# Patient Record
Sex: Female | Born: 1980 | Race: White | Hispanic: No | Marital: Single | Smoking: Current every day smoker
Health system: Southern US, Community
[De-identification: ages and names within clinical notes are randomized; demographics above are authoritative.]

## PROBLEM LIST (undated history)

## (undated) DIAGNOSIS — F419 Anxiety disorder, unspecified: Secondary | ICD-10-CM

## (undated) DIAGNOSIS — F410 Panic disorder [episodic paroxysmal anxiety] without agoraphobia: Secondary | ICD-10-CM

## (undated) DIAGNOSIS — J45909 Unspecified asthma, uncomplicated: Secondary | ICD-10-CM

## (undated) DIAGNOSIS — A048 Other specified bacterial intestinal infections: Secondary | ICD-10-CM

## (undated) DIAGNOSIS — J302 Other seasonal allergic rhinitis: Secondary | ICD-10-CM

## (undated) HISTORY — PX: TONSILLECTOMY: SUR1361

## (undated) HISTORY — PX: TUBAL LIGATION: SHX77

---

## 2005-01-10 ENCOUNTER — Emergency Department: Payer: Self-pay | Admitting: Emergency Medicine

## 2005-01-15 ENCOUNTER — Emergency Department: Payer: Self-pay | Admitting: Emergency Medicine

## 2005-01-19 ENCOUNTER — Emergency Department: Payer: Self-pay | Admitting: Emergency Medicine

## 2005-01-20 ENCOUNTER — Ambulatory Visit: Payer: Self-pay | Admitting: Unknown Physician Specialty

## 2005-01-20 ENCOUNTER — Ambulatory Visit: Payer: Self-pay | Admitting: Emergency Medicine

## 2005-04-28 ENCOUNTER — Emergency Department: Payer: Self-pay | Admitting: Emergency Medicine

## 2005-04-29 ENCOUNTER — Ambulatory Visit: Payer: Self-pay | Admitting: Emergency Medicine

## 2005-05-15 ENCOUNTER — Emergency Department: Payer: Self-pay | Admitting: General Practice

## 2005-06-28 ENCOUNTER — Emergency Department: Payer: Self-pay | Admitting: Emergency Medicine

## 2005-08-29 ENCOUNTER — Observation Stay: Payer: Self-pay

## 2005-11-11 ENCOUNTER — Observation Stay: Payer: Self-pay

## 2005-12-14 ENCOUNTER — Inpatient Hospital Stay: Payer: Self-pay | Admitting: Obstetrics & Gynecology

## 2006-03-02 ENCOUNTER — Emergency Department: Payer: Self-pay | Admitting: Emergency Medicine

## 2006-04-20 ENCOUNTER — Emergency Department: Payer: Self-pay | Admitting: Emergency Medicine

## 2006-10-05 ENCOUNTER — Ambulatory Visit: Payer: Self-pay | Admitting: Internal Medicine

## 2006-10-06 ENCOUNTER — Emergency Department: Payer: Self-pay | Admitting: Emergency Medicine

## 2006-11-26 ENCOUNTER — Ambulatory Visit: Payer: Self-pay | Admitting: Emergency Medicine

## 2007-05-22 ENCOUNTER — Emergency Department: Payer: Self-pay | Admitting: Emergency Medicine

## 2007-07-19 ENCOUNTER — Emergency Department: Payer: Self-pay | Admitting: Emergency Medicine

## 2008-01-07 ENCOUNTER — Emergency Department: Payer: Self-pay | Admitting: Emergency Medicine

## 2008-08-09 ENCOUNTER — Emergency Department: Payer: Self-pay | Admitting: Emergency Medicine

## 2008-08-18 ENCOUNTER — Emergency Department: Payer: Self-pay | Admitting: Emergency Medicine

## 2009-02-22 ENCOUNTER — Emergency Department: Payer: Self-pay | Admitting: Emergency Medicine

## 2009-08-25 ENCOUNTER — Emergency Department: Payer: Self-pay | Admitting: Emergency Medicine

## 2010-04-11 ENCOUNTER — Emergency Department: Payer: Self-pay | Admitting: Internal Medicine

## 2010-11-27 ENCOUNTER — Ambulatory Visit: Payer: Self-pay

## 2011-05-22 ENCOUNTER — Observation Stay: Payer: Self-pay | Admitting: Obstetrics and Gynecology

## 2011-05-22 LAB — URINALYSIS, COMPLETE
Bilirubin,UR: NEGATIVE
Ph: 6 (ref 4.5–8.0)
Protein: 30
WBC UR: 120 /HPF (ref 0–5)

## 2011-05-22 LAB — DRUG SCREEN, URINE
Amphetamines, Ur Screen: NEGATIVE (ref ?–1000)
Barbiturates, Ur Screen: NEGATIVE (ref ?–200)
Benzodiazepine, Ur Scrn: POSITIVE (ref ?–200)
Methadone, Ur Screen: NEGATIVE (ref ?–300)
Opiate, Ur Screen: NEGATIVE (ref ?–300)
Phencyclidine (PCP) Ur S: NEGATIVE (ref ?–25)
Tricyclic, Ur Screen: NEGATIVE (ref ?–1000)

## 2011-05-24 ENCOUNTER — Inpatient Hospital Stay: Payer: Self-pay

## 2011-05-24 LAB — CBC WITH DIFFERENTIAL/PLATELET
Basophil #: 0 10*3/uL (ref 0.0–0.1)
Eosinophil #: 0.1 10*3/uL (ref 0.0–0.7)
Eosinophil %: 1 %
HCT: 27.4 % — ABNORMAL LOW (ref 35.0–47.0)
Lymphocyte #: 2.2 10*3/uL (ref 1.0–3.6)
Lymphocyte %: 26.1 %
MCV: 80 fL (ref 80–100)
Monocyte %: 7.9 %
Neutrophil #: 5.5 10*3/uL (ref 1.4–6.5)
RBC: 3.43 10*6/uL — ABNORMAL LOW (ref 3.80–5.20)
WBC: 8.6 10*3/uL (ref 3.6–11.0)

## 2011-05-28 LAB — AEROBIC CULTURE

## 2011-07-19 ENCOUNTER — Emergency Department: Payer: Self-pay | Admitting: Emergency Medicine

## 2012-02-06 ENCOUNTER — Emergency Department: Payer: Self-pay | Admitting: Emergency Medicine

## 2012-03-22 ENCOUNTER — Emergency Department: Payer: Self-pay | Admitting: Emergency Medicine

## 2012-03-22 LAB — RAPID INFLUENZA A&B ANTIGENS

## 2012-05-26 ENCOUNTER — Encounter (HOSPITAL_COMMUNITY): Payer: Self-pay | Admitting: Emergency Medicine

## 2012-05-26 ENCOUNTER — Emergency Department (HOSPITAL_COMMUNITY)
Admission: EM | Admit: 2012-05-26 | Discharge: 2012-05-26 | Disposition: A | Discharge status: Home / Self Care | Payer: Medicaid Other | Attending: Emergency Medicine | Admitting: Emergency Medicine

## 2012-05-26 DIAGNOSIS — F411 Generalized anxiety disorder: Secondary | ICD-10-CM | POA: Insufficient documentation

## 2012-05-26 DIAGNOSIS — F17200 Nicotine dependence, unspecified, uncomplicated: Secondary | ICD-10-CM | POA: Insufficient documentation

## 2012-05-26 DIAGNOSIS — N72 Inflammatory disease of cervix uteri: Secondary | ICD-10-CM | POA: Insufficient documentation

## 2012-05-26 DIAGNOSIS — J45909 Unspecified asthma, uncomplicated: Secondary | ICD-10-CM | POA: Insufficient documentation

## 2012-05-26 DIAGNOSIS — N949 Unspecified condition associated with female genital organs and menstrual cycle: Secondary | ICD-10-CM | POA: Insufficient documentation

## 2012-05-26 DIAGNOSIS — Z3202 Encounter for pregnancy test, result negative: Secondary | ICD-10-CM | POA: Insufficient documentation

## 2012-05-26 DIAGNOSIS — Z8659 Personal history of other mental and behavioral disorders: Secondary | ICD-10-CM | POA: Insufficient documentation

## 2012-05-26 DIAGNOSIS — Z8619 Personal history of other infectious and parasitic diseases: Secondary | ICD-10-CM | POA: Insufficient documentation

## 2012-05-26 HISTORY — DX: Anxiety disorder, unspecified: F41.9

## 2012-05-26 HISTORY — DX: Unspecified asthma, uncomplicated: J45.909

## 2012-05-26 HISTORY — DX: Panic disorder (episodic paroxysmal anxiety): F41.0

## 2012-05-26 LAB — URINALYSIS, ROUTINE W REFLEX MICROSCOPIC
Hgb urine dipstick: NEGATIVE
Nitrite: NEGATIVE
Protein, ur: NEGATIVE mg/dL
Specific Gravity, Urine: 1.025 (ref 1.005–1.030)
Urobilinogen, UA: 0.2 mg/dL (ref 0.0–1.0)

## 2012-05-26 LAB — WET PREP, GENITAL
Clue Cells Wet Prep HPF POC: NONE SEEN
WBC, Wet Prep HPF POC: NONE SEEN

## 2012-05-26 MED ORDER — LIDOCAINE HCL (PF) 1 % IJ SOLN
INTRAMUSCULAR | Status: AC
  Administered 2012-05-26: 0.9 mL via INTRAMUSCULAR
  Filled 2012-05-26: qty 5

## 2012-05-26 MED ORDER — CEFTRIAXONE SODIUM 250 MG IJ SOLR
250.0000 mg | Freq: Once | INTRAMUSCULAR | Status: AC
  Administered 2012-05-26: 250 mg via INTRAMUSCULAR
  Filled 2012-05-26: qty 250

## 2012-05-26 MED ORDER — AZITHROMYCIN 250 MG PO TABS
ORAL_TABLET | ORAL | Status: AC
  Filled 2012-05-26: qty 1

## 2012-05-26 MED ORDER — AZITHROMYCIN 250 MG PO TABS
1000.0000 mg | ORAL_TABLET | Freq: Once | ORAL | Status: AC
  Administered 2012-05-26: 1000 mg via ORAL
  Filled 2012-05-26: qty 4

## 2012-05-26 NOTE — ED Notes (Signed)
Patient reports has had intercourse with person recently diagnosed with gonorrhea. Also complaining of pelvic pain, denies discharge or other symptoms.

## 2012-05-26 NOTE — ED Provider Notes (Signed)
History     CSN: [id number]  Arrival date & time 05/26/12  4098   First MD Initiated Contact with Patient 05/26/12 1935      Chief Complaint  Patient presents with  . SEXUALLY TRANSMITTED DISEASE    Patient is a 32 y.o. female presenting with abdominal pain. The history is provided by the patient.  Abdominal Pain Pain location:  RLQ and LLQ Pain quality: aching   Pain radiates to:  Does not radiate Pain severity:  Moderate Onset quality:  Gradual Duration: "several days ago" Timing:  Constant Progression:  Worsening Chronicity:  New Context: recent sexual activity   Relieved by:  Nothing Associated symptoms: no dysuria, no fever, no vaginal bleeding, no vaginal discharge and no vomiting    Pt reports she knows she has gonorrhea.  She reports she had negative gonorrhea test last month, but was told she has been exposed to gonorrhea since then . She now reports pelvic pain.  No vaginal discharge.  No fever or chills is reported.  She requests treatment and discharge home.   Past Medical History  Diagnosis Date  . Anxiety   . Asthma   . Panic attacks     Past Surgical History  Procedure Laterality Date  . Cesarean section    . Tonsillectomy      History reviewed. No pertinent family history.  History  Substance Use Topics  . Smoking status: Current Every Day Smoker  . Smokeless tobacco: Not on file  . Alcohol Use: No    OB History   Grav Para Term Preterm Abortions TAB SAB Ect Mult Living                  Review of Systems  Constitutional: Negative for fever.  Gastrointestinal: Positive for abdominal pain. Negative for vomiting.  Genitourinary: Negative for dysuria, vaginal bleeding and vaginal discharge.    Allergies  Review of patient's allergies indicates no known allergies.  Home Medications  No current outpatient prescriptions on file.  BP 123/87  Pulse 84  Temp(Src) 97.9 F (36.6 C) (Oral)  Resp 20  Ht 5\' 5"  (1.651 m)  Wt 180 lb (81.647 kg)   BMI 29.95 kg/m2  SpO2 100%  LMP 05/14/2012  Physical Exam CONSTITUTIONAL: Well developed/well nourished HEAD: Normocephalic/atraumatic EYES: EOMI/PERRL ENMT: Mucous membranes moist NECK: supple no meningeal signs SPINE:entire spine nontender CV: S1/S2 noted, no murmurs/rubs/gallops noted LUNGS: Lungs are clear to auscultation bilaterally, no apparent distress ABDOMEN: soft, nontender, no rebound or guarding GU:no cva tenderness NEURO: Pt is awake/alert, moves all extremitiesx4 EXTREMITIES: pulses normal, full ROM SKIN: warm, color normal PSYCH: anxious  ED Course  Procedures (including critical care time)  Labs Reviewed  GC/CHLAMYDIA PROBE AMP  WET PREP, GENITAL  URINALYSIS, ROUTINE W REFLEX MICROSCOPIC   Pt initially deferring pelvic exam as she wants to leave.  I told her that I can not fully r/o an acute process without pelvic exam.     Pt agreed to pelvic exam Pelvic performed with nurse present +Cmt, but no adnexal tenderness or mass.  No vag bleeding/discharge Cultures sent Will treat empirically for cervicitis   MDM  Nursing notes including past medical history and social history reviewed and considered in documentation Labs/vital reviewed and considered         Joya Gaskins, MD 05/26/12 2134

## 2012-05-30 LAB — GC/CHLAMYDIA PROBE AMP
CT Probe RNA: NEGATIVE
GC Probe RNA: NEGATIVE

## 2012-08-20 ENCOUNTER — Emergency Department: Payer: Self-pay | Admitting: Internal Medicine

## 2012-08-20 LAB — WET PREP, GENITAL

## 2012-08-20 LAB — URINALYSIS, COMPLETE
Bilirubin,UR: NEGATIVE
Nitrite: NEGATIVE
Specific Gravity: 1.013 (ref 1.003–1.030)

## 2012-08-20 LAB — PREGNANCY, URINE: Pregnancy Test, Urine: NEGATIVE m[IU]/mL

## 2012-11-09 ENCOUNTER — Emergency Department: Payer: Self-pay | Admitting: Internal Medicine

## 2012-11-14 ENCOUNTER — Emergency Department: Payer: Self-pay | Admitting: Emergency Medicine

## 2013-02-08 ENCOUNTER — Ambulatory Visit: Payer: Self-pay | Admitting: Family Medicine

## 2014-07-14 NOTE — Consult Note (Signed)
PATIENT NAME:  Kayla Mcgee, Kayla Mcgee MR#:  [id number] DATE OF BIRTH:  11/27/1980  DATE OF CONSULTATION:  05/24/2011  REFERRING PHYSICIAN:  Adria Devonarrie Klett, MD CONSULTING PHYSICIAN:  Rosalyn GessMichael E. Yemaya Barnier, MD  REASON FOR CONSULTATION: Skin rash.   HISTORY OF PRESENT ILLNESS: The patient is a 34 year old white female with a past history significant for eczema who was admitted at 22eight months gestation with premature onset of labor and who underwent C-section today with delivery of a healthy baby boy who has had a skin rash for approximately two months. She states that the rash began when she was shaving her legs and could not see her legs due to being pregnant and cut her legs bilaterally. She states that the wounds have not completely healed. She has also developed other lesions on her arm, her back, and her neck that she describes as being ulcerative and draining green material. She states these lesions can come up and become ulcerative within 24 to 48 hours. She states they are not particularly erythematous. They do not itch. They can be painful and after they have ulcerated they have taken quite a long time to heal. She had an abscess on the buttock approximately eight years ago which required incision and drainage. She has not had fevers, chills, or sweats. She has not had redness or pustular lesions. She does not describe vesical formation either. She has two daughters at home both of whom have had skin lesions. She also has eczema and has some areas of dry skin at times, but her eczema has not manifested in this manner in the past. She has not had any cultures of these lesions performed and has not been treated for it.   ALLERGIES: No known drug allergies.   PAST MEDICAL HISTORY:  1. Eczema.  2. Abscess on the buttocks, unknown if any cultures were obtained. This occurred eight years ago.   FAMILY HISTORY: Positive for esophageal cancer, but no diabetes.   SOCIAL HISTORY: The patient lives at home with  two daughters. She smokes approximately 1/2 pack of cigarettes per day. She does not drink alcohol. No history of injecting drug use. She has multiple tattoos.  CURRENT ANTIBIOTICS: None.   REVIEW OF SYSTEMS: GENERAL: No fevers, chills, or sweats. HEENT: No headaches or sinus congestion. No sore throat. NECK: No stiffness. No swollen glands. RESPIRATORY: No cough, shortness of breath, or sputum production. CARDIAC: No chest pains or palpitations. GI: No nausea. Positive abdominal pain related to her recent surgery. No nausea. No vomiting. No change in her bowels. GU: She denies any dysuria or increased frequency or hesitancy. She had some pelvic pain that she related to being in labor. She has had some lower back pain as well that she also attributed to her labor. She had noticed some change in the color of her urine, but no other specific urinary symptoms. MUSCULOSKELETAL: No complaints. SKIN: She has had multiple rashes over the arms, legs, back, and neck. NEUROLOGIC: No focal weakness. PSYCHIATRIC: No complaints. All other systems are negative.   PHYSICAL EXAMINATION:   VITAL SIGNS: T-max 98.6, pulse 76, blood pressure 100/67, and saturation 100% on room air.   GENERAL: A 34 year old white female in no acute distress.   HEENT: Normocephalic, atraumatic. Pupils are equal and reactive to light. Extraocular motion appears to be intact.   NECK: Supple. Full range of motion. Midline trachea. No lymphadenopathy. No thyromegaly.   LUNGS: Clear to auscultation bilaterally with good air movement. No focal consolidation.  HEART: Regular rate and rhythm without murmur, rub, or gallop.   ABDOMEN: Soft, mild tenderness to palpation. No hepatosplenomegaly. No hernia is noted. No CVA tenderness bilaterally.   EXTREMITIES: No evidence for tenosynovitis.   SKIN: She has multiple tattoos. She has ulcerative areas over the anterior shins bilaterally. Some of these areas appear to be old and scarred, others  appeared to be newer. There is no purulence and there is no significant surrounding erythema. There were additional lesions over her arm that again were mainly ulcerative and in various stages of healing. There were no eschars present. There was no purulence. The lesions were not surrounded by any significant erythema. Over the left buttock, there was an area that appeared to be a healing lesion versus scarring, but no new lesions. There were no lesions appreciated on her back, chest, face, or neck.   NEUROLOGIC: The patient was awake and interactive, moving all four extremities.   PSYCHIATRIC: Mood and affect appeared normal.  LABS/STUDIES: Tox screen was positive for benzodiazepines. White count 8.6, hemoglobin 9.0, platelet count 155, ANC 5.5.   Urinalysis: 2+ blood, 30 mg/dL protein, positive nitrites, 3+ leukocyte esterase, 16 red cells and 120 white cells.   A urine culture is growing greater than 100,000 CFUs/mL of Escherichia coli.   IMPRESSION: This is a 34 year old white female with a history of eczema admitted with premature labor who underwent C-section and delivered a healthy baby boy who has a chronic skin rash and Escherichia coli urinary tract infection.   RECOMMENDATIONS:  1. We will start Keflex for the urinary tract infection. Typically, although she has not had significant symptoms, pregnant women can have changes in the anatomy that makes the development of pyelonephritis more likely. Now that she is no longer pregnant, it is unclear to me how soon risk of pyelonephritis resolves. I would recommend giving her therapy. She is not breast-feeding, but Keflex would not cause problems if she changes her mind.  2. We will plan on treating for 5 to 7 days.  3. Her skin rash has some aspects which are consistent with staph infection, but others that are not very consistent. The multiple locations and the ulcerative component are seen in recurrent Staphylococcus furunculosis, but the  prolonged healing and lack of a significant pustular stage is not.  4. There are no lesions that are amenable for culture at this time.  5. We will obtain a nasal swab for culture. If positive this will be helpful, but if negative it does not rule out staph infection.  6. I will plan on seeing her as an outpatient in 2 to 3 weeks after discharge to re-evaluate her rash and decide on eradication.       7. We do not have confirmation of MRSA, but given the possibility I would continue isolation for this hospitalization.   This is a moderately complex infectious disease case. ____________________________ Rosalyn Gess. Dezhane Staten, MD meb:slb D: 05/24/2011 15:10:28 ET T: 05/24/2011 15:59:03 ET JOB#: [id number]  cc: Rosalyn Gess. Lunetta Marina, MD, <Dictator> Kalkidan Caudell E Donta Mcinroy MD ELECTRONICALLY SIGNED 05/25/2011 15:36

## 2014-07-14 NOTE — Consult Note (Signed)
Impression: 34yo WF w/ h/o eczema admitted with premature labor who underwent C-section and delivery of a healthy baby boy who has a chronic skin rash and E. coli UTI.  Will start keflex for the UTI.  She is not breast feeding, but this would not cause problems if she changes her mind.  Would plan on treating for 5-7 days. Her skin rash has some aspects which are consistent with Staph infection, but others that are not very consistent.  The multiple locations and the ulcerative component are seen in recurrent Staph furunculosis, but the prolonged healing and the lack of a significant pustular stage is not.   There are no lesions that are amenable for culture. Will obtain a nasal swab for culture.  If positive, this will be helpful, but if negative, it does not rule it out. I will plan on seeing her as an outpatient 2-3 weeks after discharge to reevaluate her rash and decide on eradication. We do not have confirmation of Methacillin Resistant Staph aureus, but given the possibility, I would continue isolation for this hospitalization.  Electronic Signatures: Porche Steinberger, Rosalyn GessMichael E (MD)  (Signed on 04-Mar-13 15:00)  Authored  Last Updated: 04-Mar-13 15:00 by Grae Leathers, Rosalyn GessMichael E (MD)

## 2014-07-14 NOTE — Op Note (Signed)
PATIENT NAME:  Kayla Mcgee, Kayla Mcgee MR#:  [id number] DATE OF BIRTH:  1980/11/10  DATE OF PROCEDURE:  05/24/2011  PREOPERATIVE DIAGNOSES:  1. Repeat cesarean section in labor.  2. Multiparous female desiring permanent sterilization.   POSTOPERATIVE DIAGNOSIS: Repeat cesarean section in labor with small window opening at the lower uterine segment.   PROCEDURES: 1. Fourth time cesarean section. 2. Unilateral tubal ligation on the right side.   ESTIMATED BLOOD LOSS: 1000 mL.  FINDINGS: Window at the site of the lower uterine segment with a preterm liveborn infant and tubal ostia seen at the point of  tubal ligation.   SURGEON: Elliot Gurneyarrie C. Daruis Swaim, MD  ANESTHESIA: Local and spinal.  DESCRIPTION OF PROCEDURE: Patient was taken to the Operating Room and placed in supine position. After adequate anesthesia was instilled the patient was prepped and draped in the usual sterile fashion. A Pfannenstiel skin incision was made through the previous skin incision and carried sharply down to the fascia. The fascia was nicked in the midline and the incision was extended in a superolateral manner with the curved Mayo scissors. Lots of scarring was encountered. The muscle bellies were sharply and bluntly dissected off the rectus fascia but this was difficult to do as there were no real fascial or muscle planes. The midline of the cluster of tissue was identified and cut open with metzenbaum scissors superiorly and inferiorly, Bladder blade was placed. Bladder flap was created. At this time a sheer window into the uterus was identified in the midsection of the previous uterine incision. Approx 3 inches wide. The incision was extended with the surgeon's fingers and the infant's head was lifted out of the pelvis. Cord was clamped and cut. Cord blood was obtained. Infant was handed off to the awaiting pediatricians. The placenta was delivered. The uterus was exteriorized and wrapped in a moist laparotomy sponge. The interior  was curetted with a moist lap sponge and running locked chromic suture was placed along the running incision of the uterus. An imbricating suture was then placed with the running chromic suture. The bladder flap was tacked back up to the uterine incision. The pelvis was copiously irrigated with warm normal saline. Blood clots were removed. Attention was turned to the tubes. It was found that the left tube had been removed completely from the ectopicand there were large blood vessels at the cornua of the uterus was identified and no tube was there. The right tube was grasped with Babcock clamp and two sutures were used to tie off the knuckle of tube. The tube was excised. Tubal ostia were seen. Good hemostasis identified. The uterus was placed back into the pelvis. Muscle bellies were approximated with a running Vicryl suture. The two spears of the On-Q pain pump were then placed through the rectus fascia. Catheters were placed through the trocars. The stems were removed. The catheter was curled up over the rectus muscle behind the rectus fascia. The rectus fascia was closed in a running Vicryl suture. There was not much subcutaneous fat to approximate so skin clips were placed. The catheters were primed and loaded. 4 x 4's were  incision to accommodate the catheter. The two catheters were wound together and placed on the 4 x 4's. Steri-Strips were placed across this to attach them to the 4 x 4's. Tegaderm were placed. Telfa was placed. Bandage was placed along the incision. Clear urine was noted in the Foley bag. The uterus was found to be firm and the patient was laid supine  and taken to recovery after having tolerated the procedure well.    ____________________________ Elliot Gurney, MD cck:cms D: 05/24/2011 23:07:00 ET T: 05/25/2011 10:04:21 ET JOB#: [id number]  cc: Elliot Gurney, MD, <Dictator> Elliot Gurney MD ELECTRONICALLY SIGNED 05/27/2011 10:55

## 2014-07-30 NOTE — H&P (Signed)
L&D Evaluation:  History Expanded:   HPI 34 yo [id number] who presetns with contractions that are very painful. she uis schedled for a csection on 3/21 her edc is 3/28. she has a poor history of making appointments due to incarceration. she is GBS unknown, she never ad her 3 hr ogtt and she has been referred to Dr Leavy CellaBlocker for poss MRSA on her buttocks.Rhett Bannister.    Gravida 8    Term 5    Blood Type unknown    Group B Strep Results (Result >5wks must be treated as unknown) unknown/result > 5 weeks ago    Maternal HIV Negative    Maternal Syphilis Ab Nonreactive    Maternal Varicella Immune    Rubella Results immune    Maternal T-Dap Immune    [facility]    Presents with contractions    Patient's Medical History Asthma    Patient's Surgical History Abdominal Surgery  Appendectomy  D&C  Previous C-Section    Medications Pre Natal Vitamins  zoloft, trazadone, percocet, PNV    Allergies NKDA    Social History tobacco  EtOH    Family History Non-Contributory   Exam:   Vital Signs stable    Urine Protein not completed    General no apparent distress    Mental Status clear    Chest clear    Heart normal sinus rhythm    Abdomen gravid, non-tender    Pelvic no external lesions    Ucx regular    Skin dry    Other cervix is closed and soft and VERY posterior, fetus at -4 station   Impression:   Impression early labor   Plan:   Plan UA, monitor contractions and for cervical change, antibiotics for GBBS prophylaxis, fluids    Comments terb subq and antibiotis now that urine shows an infectionb    Follow Up Appointment need to schedule   Electronic Signatures: Adria DevonKlett, Kelina Beauchamp (MD)  (Signed 02-Mar-13 13:12)  Authored: L&D Evaluation   Last Updated: 02-Mar-13 13:12 by Adria DevonKlett, Keaten Mashek (MD)

## 2014-07-30 NOTE — H&P (Signed)
L&D Evaluation:  History Expanded:   HPI 34 yo [id number] who presetns with contractions that are very painful. she is schedled for a csection on 3/21 her edc is 3/28. she has a poor history of making appointments due to incarceration. she is GBS unknown, she never had her 3 hr ogtt and she has been referred to Dr Leavy CellaBlocker for poss MRSA on her buttocks..She does not rtespond to terb todecrease the contractions    Gravida 8    Term 5    PreTerm 0    Abortion 2    Living 5    Blood Type unknown    Group B Strep Results (Result >5wks must be treated as unknown) unknown/result > 5 weeks ago    Maternal HIV Negative    Maternal Syphilis Ab Nonreactive    Maternal Varicella Immune    Rubella Results immune    Maternal T-Dap Immune    [facility]    Presents with contractions    Patient's Medical History Asthma    Patient's Surgical History Abdominal Surgery  Appendectomy  D&C  Previous C-Section    Medications Pre Natal Vitamins  zoloft, trazadone, percocet, PNV    Allergies NKDA    Social History tobacco  EtOH    Family History Non-Contributory    Current Prenatal Course Notable For PreTerm Labor   ROS:   ROS in apparent distress   Exam:   Vital Signs stable    Urine Protein not completed    General no apparent distress    Mental Status clear    Chest clear    Heart normal sinus rhythm    Abdomen gravid, non-tender    Estimated Fetal Weight Average for gestational age    Fetal Position vertex    Back no CVAT    Pelvic no external lesions, 80% fetal head at o station    Mebranes Intact    FHT normal rate with no decels    Ucx regular    Ucx Frequency 5 min    Skin dry   Impression:   Impression early labor, previous csection x 3 with vbac inbetween two and three   Plan:   Plan UA, monitor contractions and for cervical change, antibiotics for GBBS prophylaxis, fluids    Comments terb subq and antibiotis now that urine shows an  infectionb    Follow Up Appointment need to schedule   Electronic Signatures: Adria DevonKlett, Seva Chancy (MD)  (Signed 04-Mar-13 01:46)  Authored: L&D Evaluation   Last Updated: 04-Mar-13 01:46 by Adria DevonKlett, Jaleah Lefevre (MD)

## 2015-02-19 ENCOUNTER — Emergency Department
Admission: EM | Admit: 2015-02-19 | Discharge: 2015-02-19 | Disposition: A | Discharge status: Home / Self Care | Payer: Medicaid Other | Attending: Emergency Medicine | Admitting: Emergency Medicine

## 2015-02-19 ENCOUNTER — Emergency Department: Payer: Medicaid Other

## 2015-02-19 DIAGNOSIS — J40 Bronchitis, not specified as acute or chronic: Secondary | ICD-10-CM

## 2015-02-19 DIAGNOSIS — Z202 Contact with and (suspected) exposure to infections with a predominantly sexual mode of transmission: Secondary | ICD-10-CM | POA: Insufficient documentation

## 2015-02-19 DIAGNOSIS — N760 Acute vaginitis: Secondary | ICD-10-CM | POA: Insufficient documentation

## 2015-02-19 DIAGNOSIS — J209 Acute bronchitis, unspecified: Secondary | ICD-10-CM | POA: Insufficient documentation

## 2015-02-19 DIAGNOSIS — B9689 Other specified bacterial agents as the cause of diseases classified elsewhere: Secondary | ICD-10-CM

## 2015-02-19 DIAGNOSIS — F17200 Nicotine dependence, unspecified, uncomplicated: Secondary | ICD-10-CM | POA: Insufficient documentation

## 2015-02-19 LAB — WET PREP, GENITAL
SPERM: NONE SEEN
TRICH WET PREP: NONE SEEN
YEAST WET PREP: NONE SEEN

## 2015-02-19 LAB — CHLAMYDIA/NGC RT PCR (ARMC ONLY)
Chlamydia Tr: NOT DETECTED
N GONORRHOEAE: DETECTED — AB

## 2015-02-19 MED ORDER — BENZONATATE 100 MG PO CAPS: 100.0000 mg | ORAL_CAPSULE | Freq: Three times a day (TID) | ORAL | Status: DC | PRN

## 2015-02-19 MED ORDER — IPRATROPIUM-ALBUTEROL 0.5-2.5 (3) MG/3ML IN SOLN
3.0000 mL | Freq: Once | RESPIRATORY_TRACT | Status: AC
  Administered 2015-02-19: 3 mL via RESPIRATORY_TRACT
  Filled 2015-02-19: qty 3

## 2015-02-19 MED ORDER — ALBUTEROL SULFATE HFA 108 (90 BASE) MCG/ACT IN AERS: 2.0000 | INHALATION_SPRAY | Freq: Four times a day (QID) | RESPIRATORY_TRACT | Status: DC | PRN

## 2015-02-19 MED ORDER — AZITHROMYCIN 250 MG PO TABS
1000.0000 mg | ORAL_TABLET | Freq: Once | ORAL | Status: AC
  Administered 2015-02-19: 1000 mg via ORAL
  Filled 2015-02-19: qty 4

## 2015-02-19 MED ORDER — AZITHROMYCIN 250 MG PO TABS
ORAL_TABLET | ORAL | Status: AC
  Filled 2015-02-19: qty 1

## 2015-02-19 MED ORDER — METRONIDAZOLE 500 MG PO TABS: 500.0000 mg | ORAL_TABLET | Freq: Two times a day (BID) | ORAL | Status: AC

## 2015-02-19 MED ORDER — CEFTRIAXONE SODIUM 250 MG IJ SOLR
250.0000 mg | Freq: Once | INTRAMUSCULAR | Status: AC
Start: 2015-02-19 — End: 2015-02-19
  Administered 2015-02-19: 250 mg via INTRAMUSCULAR
  Filled 2015-02-19: qty 250

## 2015-02-19 MED ORDER — FLUTICASONE PROPIONATE 50 MCG/ACT NA SUSP
1.0000 | Freq: Every day | NASAL | Status: DC
Start: 2015-02-19 — End: 2016-01-06

## 2015-02-19 NOTE — ED Notes (Signed)
Also she was exposed to STD
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
## 2015-02-19 NOTE — Discharge Instructions (Signed)
Sexually Transmitted Disease °A sexually transmitted disease (STD) is a disease or infection that may be passed (transmitted) from person to person, usually during sexual activity. This may happen by way of saliva, semen, blood, vaginal mucus, or urine. Common STDs include: °· Gonorrhea. °· Chlamydia. °· Syphilis. °· HIV and AIDS. °· Genital herpes. °· Hepatitis B and C. °· Trichomonas. °· Human papillomavirus (HPV). °· Pubic lice. °· Scabies. °· Mites. °· Bacterial vaginosis. °WHAT ARE CAUSES OF STDs? °An STD may be caused by bacteria, a virus, or parasites. STDs are often transmitted during sexual activity if one person is infected. However, they may also be transmitted through nonsexual means. STDs may be transmitted after:  °· Sexual intercourse with an infected person. °· Sharing sex toys with an infected person. °· Sharing needles with an infected person or using unclean piercing or tattoo needles. °· Having intimate contact with the genitals, mouth, or rectal areas of an infected person. °· Exposure to infected fluids during birth. °WHAT ARE THE SIGNS AND SYMPTOMS OF STDs? °Different STDs have different symptoms. Some people may not have any symptoms. If symptoms are present, they may include: °· Painful or bloody urination. °· Pain in the pelvis, abdomen, vagina, anus, throat, or eyes. °· A skin rash, itching, or irritation. °· Growths, ulcerations, blisters, or sores in the genital and anal areas. °· Abnormal vaginal discharge with or without bad odor. °· Penile discharge in men. °· Fever. °· Pain or bleeding during sexual intercourse. °· Swollen glands in the groin area. °· Yellow skin and eyes (jaundice). This is seen with hepatitis. °· Swollen testicles. °· Infertility. °· Sores and blisters in the mouth. °HOW ARE STDs DIAGNOSED? °To make a diagnosis, your health care provider may: °· Take a medical history. °· Perform a physical exam. °· Take a sample of any discharge to examine. °· Swab the throat,  cervix, opening to the penis, rectum, or vagina for testing. °· Test a sample of your first morning urine. °· Perform blood tests. °· Perform a Pap test, if this applies. °· Perform a colposcopy. °· Perform a laparoscopy. °HOW ARE STDs TREATED? °Treatment depends on the STD. Some STDs may be treated but not cured. °· Chlamydia, gonorrhea, trichomonas, and syphilis can be cured with antibiotic medicine. °· Genital herpes, hepatitis, and HIV can be treated, but not cured, with prescribed medicines. The medicines lessen symptoms. °· Genital warts from HPV can be treated with medicine or by freezing, burning (electrocautery), or surgery. Warts may come back. °· HPV cannot be cured with medicine or surgery. However, abnormal areas may be removed from the cervix, vagina, or vulva. °· If your diagnosis is confirmed, your recent sexual partners need treatment. This is true even if they are symptom-free or have a negative culture or evaluation. They should not have sex until their health care providers say it is okay. °· Your health care provider may test you for infection again 3 months after treatment. °HOW CAN I REDUCE MY RISK OF GETTING AN STD? °Take these steps to reduce your risk of getting an STD: °· Use latex condoms, dental dams, and water-soluble lubricants during sexual activity. Do not use petroleum jelly or oils. °· Avoid having multiple sex partners. °· Do not have sex with someone who has other sex partners °· Do not have sex with anyone you do not know or who is at high risk for an STD. °· Avoid risky sex practices that can break your skin. °· Do not have sex   if you have open sores on your mouth or skin.  Avoid drinking too much alcohol or taking illegal drugs. Alcohol and drugs can affect your judgment and put you in a vulnerable position.  Avoid engaging in oral and anal sex acts.  Get vaccinated for HPV and hepatitis. If you have not received these vaccines in the past, talk to your health care  provider about whether one or both might be right for you.  If you are at risk of being infected with HIV, it is recommended that you take a prescription medicine daily to prevent HIV infection. This is called pre-exposure prophylaxis (PrEP). You are considered at risk if:  You are a man who has sex with other men (MSM).  You are a heterosexual man or woman and are sexually active with more than one partner.  You take drugs by injection.  You are sexually active with a partner who has HIV.  Talk with your health care provider about whether you are at high risk of being infected with HIV. If you choose to begin PrEP, you should first be tested for HIV. You should then be tested every 3 months for as long as you are taking PrEP. WHAT SHOULD I DO IF I THINK I HAVE AN STD?  See your health care provider.  Tell your sexual partner(s). They should be tested and treated for any STDs.  Do not have sex until your health care provider says it is okay. WHEN SHOULD I GET IMMEDIATE MEDICAL CARE? Contact your health care provider right away if:   You have severe abdominal pain.  You are a man and notice swelling or pain in your testicles.  You are a woman and notice swelling or pain in your vagina.   This information is not intended to replace advice given to you by your health care provider. Make sure you discuss any questions you have with your health care provider.   Document Released: 05/29/2002 Document Revised: 03/29/2014 Document Reviewed: 09/26/2012 Elsevier Interactive Patient Education 2016 [employer]  Upper Respiratory Infection, Adult Most upper respiratory infections (URIs) are a viral infection of the air passages leading to the lungs. A URI affects the nose, throat, and upper air passages. The most common type of URI is nasopharyngitis and is typically referred to as "the common cold." URIs run their course and usually go away on their own. Most of the time, a URI does not  require medical attention, but sometimes a bacterial infection in the upper airways can follow a viral infection. This is called a secondary infection. Sinus and middle ear infections are common types of secondary upper respiratory infections. Bacterial pneumonia can also complicate a URI. A URI can worsen asthma and chronic obstructive pulmonary disease (COPD). Sometimes, these complications can require emergency medical care and may be life threatening.  CAUSES Almost all URIs are caused by viruses. A virus is a type of germ and can spread from one person to another.  RISKS FACTORS You may be at risk for a URI if:   You smoke.   You have chronic heart or lung disease.  You have a weakened defense (immune) system.   You are very young or very old.   You have nasal allergies or asthma.  You work in crowded or poorly ventilated areas.  You work in health care facilities or schools. SIGNS AND SYMPTOMS  Symptoms typically develop 2-3 days after you come in contact with a cold virus. Most viral URIs last  7-10 days. However, viral URIs from the influenza virus (flu virus) can last 14-18 days and are typically more severe. Symptoms may include:   Runny or stuffy (congested) nose.   Sneezing.   Cough.   Sore throat.   Headache.   Fatigue.   Fever.   Loss of appetite.   Pain in your forehead, behind your eyes, and over your cheekbones (sinus pain).  Muscle aches.  DIAGNOSIS  Your health care provider may diagnose a URI by:  Physical exam.  Tests to check that your symptoms are not due to another condition such as:  Strep throat.  Sinusitis.  Pneumonia.  Asthma. TREATMENT  A URI goes away on its own with time. It cannot be cured with medicines, but medicines may be prescribed or recommended to relieve symptoms. Medicines may help:  Reduce your fever.  Reduce your cough.  Relieve nasal congestion. HOME CARE INSTRUCTIONS   Take medicines only as  directed by your health care provider.   Gargle warm saltwater or take cough drops to comfort your throat as directed by your health care provider.  Use a warm mist humidifier or inhale steam from a shower to increase air moisture. This may make it easier to breathe.  Drink enough fluid to keep your urine clear or pale yellow.   Eat soups and other clear broths and maintain good nutrition.   Rest as needed.   Return to work when your temperature has returned to normal or as your health care provider advises. You may need to stay home longer to avoid infecting others. You can also use a face mask and careful hand washing to prevent spread of the virus.  Increase the usage of your inhaler if you have asthma.   Do not use any tobacco products, including cigarettes, chewing tobacco, or electronic cigarettes. If you need help quitting, ask your health care provider. PREVENTION  The best way to protect yourself from getting a cold is to practice good hygiene.   Avoid oral or hand contact with people with cold symptoms.   Wash your hands often if contact occurs.  There is no clear evidence that vitamin C, vitamin E, echinacea, or exercise reduces the chance of developing a cold. However, it is always recommended to get plenty of rest, exercise, and practice good nutrition.  SEEK MEDICAL CARE IF:   You are getting worse rather than better.   Your symptoms are not controlled by medicine.   You have chills.  You have worsening shortness of breath.  You have brown or red mucus.  You have yellow or brown nasal discharge.  You have pain in your face, especially when you bend forward.  You have a fever.  You have swollen neck glands.  You have pain while swallowing.  You have white areas in the back of your throat. SEEK IMMEDIATE MEDICAL CARE IF:   You have severe or persistent:  Headache.  Ear pain.  Sinus pain.  Chest pain.  You have chronic lung disease and  any of the following:  Wheezing.  Prolonged cough.  Coughing up blood.  A change in your usual mucus.  You have a stiff neck.  You have changes in your:  Vision.  Hearing.  Thinking.  Mood. MAKE SURE YOU:   Understand these instructions.  Will watch your condition.  Will get help right away if you are not doing well or get worse.   This information is not intended to replace advice given to you  by your health care provider. Make sure you discuss any questions you have with your health care provider.   Document Released: 09/01/2000 Document Revised: 07/23/2014 Document Reviewed: 06/13/2013 Elsevier Interactive Patient Education [employer]   Your chest x-ray was normal.  You have been treated for an STD exposure.  You should take the prescription meds for your URI symptoms. Increase fluid and take and OTC allergy medicine as needed. You will be called if your other tests are positive.  Bacterial Vaginosis Bacterial vaginosis is an infection of the vagina. It happens when too many germs (bacteria) grow in the vagina. Having this infection puts you at risk for getting other infections from sex. Treating this infection can help lower your risk for other infections, such as:   Chlamydia.  Gonorrhea.  HIV.  Herpes. HOME CARE  Take your medicine as told by your doctor.  Finish your medicine even if you start to feel better.  Tell your sex partner that you have an infection. They should see their doctor for treatment.  During treatment:  Avoid sex or use condoms correctly.  Do not douche.  Do not drink alcohol unless your doctor tells you it is ok.  Do not breastfeed unless your doctor tells you it is ok. GET HELP IF:  You are not getting better after 3 days of treatment.  You have more grey fluid (discharge) coming from your vagina than before.  You have more pain than before.  You have a fever. MAKE SURE YOU:   Understand these  instructions.  Will watch your condition.  Will get help right away if you are not doing well or get worse.   This information is not intended to replace advice given to you by your health care provider. Make sure you discuss any questions you have with your health care provider.   Document Released: 12/16/2007 Document Revised: 03/29/2014 Document Reviewed: 10/18/2012 Elsevier Interactive Patient Education [employer]

## 2015-02-19 NOTE — ED Notes (Signed)
Pt states that she has had yellow productive cough X 2 weeks. Pt also reports that her intimate partner was dx with gonorrhea and pt would like to be treated. Pt alert and oriented X4, active, cooperative, pt in NAD. RR even and unlabored, color WNL.

## 2015-02-20 ENCOUNTER — Telehealth: Payer: Self-pay | Admitting: Emergency Medicine

## 2015-02-20 NOTE — ED Notes (Signed)
Called patient to inform of positive gonorrhea test.  Was treated in the ED.  Left message.

## 2016-01-06 ENCOUNTER — Encounter: Payer: Self-pay | Admitting: Emergency Medicine

## 2016-01-06 ENCOUNTER — Emergency Department: Payer: Self-pay

## 2016-01-06 ENCOUNTER — Emergency Department
Admission: EM | Admit: 2016-01-06 | Discharge: 2016-01-06 | Disposition: A | Discharge status: Home / Self Care | Payer: Self-pay | Attending: Emergency Medicine | Admitting: Emergency Medicine

## 2016-01-06 DIAGNOSIS — F17200 Nicotine dependence, unspecified, uncomplicated: Secondary | ICD-10-CM | POA: Insufficient documentation

## 2016-01-06 DIAGNOSIS — K209 Esophagitis, unspecified without bleeding: Secondary | ICD-10-CM

## 2016-01-06 DIAGNOSIS — Z79899 Other long term (current) drug therapy: Secondary | ICD-10-CM | POA: Insufficient documentation

## 2016-01-06 DIAGNOSIS — J0141 Acute recurrent pansinusitis: Secondary | ICD-10-CM | POA: Insufficient documentation

## 2016-01-06 DIAGNOSIS — J45909 Unspecified asthma, uncomplicated: Secondary | ICD-10-CM | POA: Insufficient documentation

## 2016-01-06 HISTORY — DX: Other seasonal allergic rhinitis: J30.2

## 2016-01-06 MED ORDER — RANITIDINE HCL 150 MG PO TABS: 150.0000 mg | ORAL_TABLET | Freq: Two times a day (BID) | ORAL | 0 refills | Status: DC

## 2016-01-06 MED ORDER — PREDNISONE 10 MG PO TABS: ORAL_TABLET | ORAL | 0 refills | Status: DC

## 2016-01-06 MED ORDER — AZITHROMYCIN 250 MG PO TABS: ORAL_TABLET | ORAL | 0 refills | Status: DC

## 2016-01-06 NOTE — Discharge Instructions (Signed)
Take medication as directed. Decrease smoking if possible. Increase fluids. Call community health clinics today to get an appointment so you can establish a primary care provider.

## 2016-01-06 NOTE — ED Triage Notes (Signed)
Pt ambulatory to triage with steady gait with c/o nasal congestion for 2 weeks and coughing up "black speckled" mucus for the past 6 months. Pt is a current smoker. Pt reports has taking OTC without relief.

## 2016-01-06 NOTE — ED Notes (Signed)
See triage note  States she has had prod cough and sinus pressure for couple of weeks  No fever

## 2016-01-06 NOTE — ED Provider Notes (Signed)
[facility] Emergency Department Provider Note  ____________________________________________   First MD Initiated Contact with Patient 01/06/16 [phone]     (approximate)  I have reviewed the triage vital signs and the nursing notes.   HISTORY  Chief Complaint Nasal Congestion and Cough   HPI Kayla Mcgee is a 35 y.o. female presents with nasal congestion for 10 days and clearing her throat with "black specks" in her sputum for the past 6 months.  Admits to intermittent fever and green/clear mucus nasal discharge.  Pt has no septum due to past abuse of cocaine and has a 20 pack year smoking history.  Claritin with pseudophed helped but did not take the sinus pressure away Pt was in jail for 6 months and has been out for one month.  She was tested for tb in jail and never had a positive tb test, or a chest x-ray.  Denies unintentional weight loss and night sweats.  She admits to heartburn getting progressively worse for the past six months with severe burning in the epigastric region with pain after eating and when lying down.  Zantac improved her symptoms, but she could "no longer afford zantac".      Past Medical History:  Diagnosis Date  . Anxiety   . Asthma   . Panic attacks   . Seasonal allergies     There are no active problems to display for this patient.   Past Surgical History:  Procedure Laterality Date  . CESAREAN SECTION    . TONSILLECTOMY      Prior to Admission medications   Medication Sig Start Date End Date Taking? Authorizing Provider  albuterol (PROVENTIL HFA;VENTOLIN HFA) 108 (90 BASE) MCG/ACT inhaler Inhale 2 puffs into the lungs every 6 (six) hours as needed for wheezing or shortness of breath. 02/19/15   Jenise V Bacon Menshew, PA-C  azithromycin (ZITHROMAX Z-PAK) 250 MG tablet Take 2 tablets (500 mg) on  Day 1,  followed by 1 tablet (250 mg) once daily on Days 2 through 5. 01/06/16   Tommi Rumps, PA-C  predniSONE  (DELTASONE) 10 MG tablet Take 6 tablets  today, on day 2 take 5 tablets, day 3 take 4 tablets, day 4 take 3 tablets, day 5 take  2 tablets and 1 tablet the last day 01/06/16   Tommi Rumps, PA-C  ranitidine (ZANTAC) 150 MG tablet Take 1 tablet (150 mg total) by mouth 2 (two) times daily. 01/06/16 01/05/17  Tommi Rumps, PA-C    Allergies Review of patient's allergies indicates no known allergies. positive for seasonal allergies  No family history on file.  Social History Social History  Substance Use Topics  . Smoking status: Current Every Day Smoker    Packs/day: 0.50  . Smokeless tobacco: Never Used  . Alcohol use No    Review of Systems Constitutional: No fever/chills, night sweats, or unintentional weight loss Eyes: No visual changes. ENT: No sore throat. Cardiovascular: Denies chest pain. Respiratory: Denies shortness of breath. Gastrointestinal: No nausea, no vomiting.  No diarrhea.  No constipation. Admits to epigastric burning and dyspepsia Genitourinary: Negative for dysuria. Musculoskeletal: Negative for back pain. Skin: Negative for rash. Neurological: Negative for focal weakness or numbness. Admits to headaches associated with the sinus pressure 10-point ROS otherwise negative.  ____________________________________________   PHYSICAL EXAM:  VITAL SIGNS: ED Triage Vitals  Enc Vitals Group     BP 01/06/16 0640 (!) 106/56     Pulse Rate 01/06/16 0640 68  Resp 01/06/16 0640 18     Temp 01/06/16 0640 98.4 F (36.9 C)     Temp Source 01/06/16 0640 Oral     SpO2 01/06/16 0640 98 %     Weight 01/06/16 0641 200 lb (90.7 kg)     Height 01/06/16 0641 5\' 6"  (1.676 m)     Head Circumference --      Peak Flow --      Pain Score --      Pain Loc --      Pain Edu? --      Excl. in GC? --    Constitutional: Alert and oriented. Well appearing and in mild acute distress. Eyes:  PERRL. EOMI. Conjunctival injection Head: Atraumatic. Nose: positive for  congestion/rhinnorhea.  Tender to palpation over maxillary sinuses.  Mouth/Throat: Mucous membranes are moist.  Oropharynx non-erythematous and no exudates. Neck: No stridor.   Cardiovascular: Normal rate, regular rhythm. Grossly normal heart sounds.  Good peripheral circulation. Respiratory: Normal respiratory effort.  No retractions. Lungs CTAB. Mild wheezes BIL heard.  Gastrointestinal: Soft. No distention. No abdominal bruits. No CVA tenderness. TTP over epigastric region Musculoskeletal: No lower extremity tenderness nor edema.  No joint effusions. Neurologic:  Normal speech and language. No gross focal neurologic deficits are appreciated. No gait instability. Skin:  Skin is warm, dry and intact. No rash noted. Psychiatric: Mood and affect are normal. Speech and behavior are normal. _______________________________________  RADIOLOGY  Chest x-ray per radiologist is negative for cardiopulmonary disease. ____________________________________________   PROCEDURES  Procedure(s) performed: None  Procedures  Critical Care performed: No  ____________________________________________   INITIAL IMPRESSION / ASSESSMENT AND PLAN / ED COURSE  Pertinent labs & imaging results that were available during my care of the patient were reviewed by me and considered in my medical decision making (see chart for details).    Clinical Course  Patient was given list of medical clinics that charge per sliding scale. She does call make an appointment and get established for primary care doctor. Patient is given a prescription for Zithromax that she has taken in the past without any difficulty. Prednisone Dosepak. And Zantac 150 mg 1 twice a day #60 no refill.   ____________________________________________   FINAL CLINICAL IMPRESSION(S) / ED DIAGNOSES  Final diagnoses:  Acute recurrent pansinusitis  Esophagitis      NEW MEDICATIONS STARTED DURING THIS VISIT:  Discharge Medication List as  of 01/06/2016  8:37 AM    START taking these medications   Details  azithromycin (ZITHROMAX Z-PAK) 250 MG tablet Take 2 tablets (500 mg) on  Day 1,  followed by 1 tablet (250 mg) once daily on Days 2 through 5., Print    predniSONE (DELTASONE) 10 MG tablet Take 6 tablets  today, on day 2 take 5 tablets, day 3 take 4 tablets, day 4 take 3 tablets, day 5 take  2 tablets and 1 tablet the last day, Print    ranitidine (ZANTAC) 150 MG tablet Take 1 tablet (150 mg total) by mouth 2 (two) times daily., Starting Tue 01/06/2016, Until Wed 01/05/2017, Print         Note:  This document was prepared using Dragon voice recognition software and may include unintentional dictation errors.    Tommi RumpsRhonda L Summers, PA-C 01/06/16 1416    Governor Rooksebecca Lord, MD 01/06/16 2033

## 2016-03-02 ENCOUNTER — Encounter: Payer: Self-pay | Admitting: Emergency Medicine

## 2016-03-02 ENCOUNTER — Emergency Department
Admission: EM | Admit: 2016-03-02 | Discharge: 2016-03-02 | Disposition: A | Discharge status: Home / Self Care | Payer: Medicaid Other | Attending: Emergency Medicine | Admitting: Emergency Medicine

## 2016-03-02 DIAGNOSIS — J329 Chronic sinusitis, unspecified: Secondary | ICD-10-CM | POA: Insufficient documentation

## 2016-03-02 DIAGNOSIS — B9789 Other viral agents as the cause of diseases classified elsewhere: Secondary | ICD-10-CM

## 2016-03-02 DIAGNOSIS — H9201 Otalgia, right ear: Secondary | ICD-10-CM

## 2016-03-02 DIAGNOSIS — J45909 Unspecified asthma, uncomplicated: Secondary | ICD-10-CM | POA: Insufficient documentation

## 2016-03-02 DIAGNOSIS — Z79899 Other long term (current) drug therapy: Secondary | ICD-10-CM | POA: Insufficient documentation

## 2016-03-02 DIAGNOSIS — F17200 Nicotine dependence, unspecified, uncomplicated: Secondary | ICD-10-CM | POA: Insufficient documentation

## 2016-03-02 MED ORDER — PREDNISONE 5 MG PO TABS: 30.0000 mg | ORAL_TABLET | Freq: Every day | ORAL | 0 refills | Status: DC

## 2016-03-02 MED ORDER — CETIRIZINE HCL 10 MG PO TABS: 10.0000 mg | ORAL_TABLET | Freq: Every day | ORAL | 0 refills | Status: DC

## 2016-03-02 NOTE — ED Provider Notes (Signed)
[facility] Emergency Department Provider Note  ____________________________________________  Time seen: Approximately 1:27 PM  I have reviewed the triage vital signs and the nursing notes.   HISTORY  Chief Complaint Otalgia    HPI Trey SailorsCarrisa L Cisar is a 35 y.o. female , NAD, presents to the emergency department with 2 day history of right ear pain. States that over the last week she has had nasal congestion and headache in which she thought was related to her seasonal allergies. Over the last 2-3 days she has had increasing sinus pressure, sinus headache and right ear pain. Has had no fevers, chills or body aches. Denies chest pain, shortness of breath, cough or chest congestion. Has had no abdominal pain, nausea or vomiting. Has had no dizziness or lightheadedness. Has noted some clear drainage from right ear but no purulent drainage. Denies any immersion in water or our ear pain. Has been taking Claritin-D without significant relief of her symptoms.   Past Medical History:  Diagnosis Date  . Anxiety   . Asthma   . Panic attacks   . Seasonal allergies     There are no active problems to display for this patient.   Past Surgical History:  Procedure Laterality Date  . CESAREAN SECTION    . TONSILLECTOMY      Prior to Admission medications   Medication Sig Start Date End Date Taking? Authorizing Provider  albuterol (PROVENTIL HFA;VENTOLIN HFA) 108 (90 BASE) MCG/ACT inhaler Inhale 2 puffs into the lungs every 6 (six) hours as needed for wheezing or shortness of breath. 02/19/15   Jenise V Bacon Menshew, PA-C  azithromycin (ZITHROMAX Z-PAK) 250 MG tablet Take 2 tablets (500 mg) on  Day 1,  followed by 1 tablet (250 mg) once daily on Days 2 through 5. 01/06/16   Tommi Rumpshonda L Summers, PA-C  cetirizine (ZYRTEC) 10 MG tablet Take 1 tablet (10 mg total) by mouth daily. 03/02/16   Berdene Askari L Keena Heesch, PA-C  predniSONE (DELTASONE) 5 MG tablet Take 6 tablets (30 mg total)  by mouth daily with breakfast. May take for up to 5 days.  Do not take any NSAIDs with this medication. Take with food. 03/02/16   Hamza Empson L Aerie Donica, PA-C  ranitidine (ZANTAC) 150 MG tablet Take 1 tablet (150 mg total) by mouth 2 (two) times daily. 01/06/16 01/05/17  Tommi Rumpshonda L Summers, PA-C    Allergies Patient has no known allergies.  No family history on file.  Social History Social History  Substance Use Topics  . Smoking status: Current Every Day Smoker    Packs/day: 0.50  . Smokeless tobacco: Never Used  . Alcohol use No     Review of Systems  Constitutional: No fever/chills Eyes: No visual changes.  ENT: Positive sinus pressure, ear pain, ear drainage, nasal congestion. No sore throat. Cardiovascular: No chest pain. Respiratory: No cough, chest congestion. No shortness of breath. No wheezing.  Gastrointestinal: No abdominal pain.  No nausea, vomiting.  Musculoskeletal: Negative for general myalgias.  Skin: Negative for rash. Neurological: Positive for sinus headaches, focal weakness or numbness. No dizziness, lightheadedness. 10-point ROS otherwise negative.  ____________________________________________   PHYSICAL EXAM:  VITAL SIGNS: ED Triage Vitals  Enc Vitals Group     BP 03/02/16 1309 125/72     Pulse Rate 03/02/16 1309 74     Resp 03/02/16 1309 17     Temp 03/02/16 1309 98.1 F (36.7 C)     Temp Source 03/02/16 1309 Oral     SpO2  03/02/16 1309 98 %     Weight 03/02/16 1309 220 lb (99.8 kg)     Height 03/02/16 1309 5\' 6"  (1.676 m)     Head Circumference --      Peak Flow --      Pain Score 03/02/16 1310 3     Pain Loc --      Pain Edu? --      Excl. in GC? --      Constitutional: Alert and oriented. Well appearing and in no acute distress. Eyes: Conjunctivae are normal Without icterus, injection or discharge Head: Atraumatic. ENT:      Ears: Bilateral TMs visualized with moderate serous effusion, mild bulging but no perforation, erythema. Bilateral  external ear canals without swelling, erythema, discharge.      Nose: Mild nasal congestion with clear rhinorrhea and mildly injected turbinates.      Mouth/Throat: Mucous membranes are moist. Pharynx without erythema, swelling, exudate. Clear postnasal drip. Airways patent. Uvula is midline. Neck: Supple with full range of motion Hematological/Lymphatic/Immunilogical: No cervical lymphadenopathy. Cardiovascular: Normal rate, regular rhythm. Normal S1 and S2.  Good peripheral circulation. Respiratory: Normal respiratory effort without tachypnea or retractions. Lungs CTAB with breath sounds noted in all lung fields. No wheeze, rhonchi, rales. Neurologic:  Normal speech and language. No gross focal neurologic deficits are appreciated.  Skin:  Skin is warm, dry and intact. No rash noted. Psychiatric: Mood and affect are normal. Speech and behavior are normal. Patient exhibits appropriate insight and judgement.   ____________________________________________   LABS  None ____________________________________________  EKG  None ____________________________________________  RADIOLOGY  None ____________________________________________    PROCEDURES  Procedure(s) performed: None   Procedures   Medications - No data to display   ____________________________________________   INITIAL IMPRESSION / ASSESSMENT AND PLAN / ED COURSE  Pertinent labs & imaging results that were available during my care of the patient were reviewed by me and considered in my medical decision making (see chart for details).  Clinical Course     Patient's diagnosis is consistent with Viral sinusitis and ear pain. Patient will be discharged home with prescriptions for prednisone and Zyrtec to take as directed. Patient may discontinue Claritin-D. May take over-the-counter Tylenol as needed for pain. [facility] if symptoms persist past this treatment course. Patient  is given ED precautions to return to the ED for any worsening or new symptoms.    ____________________________________________  FINAL CLINICAL IMPRESSION(S) / ED DIAGNOSES  Final diagnoses:  Viral sinusitis  Right ear pain      NEW MEDICATIONS STARTED DURING THIS VISIT:  Discharge Medication List as of 03/02/2016  1:36 PM    START taking these medications   Details  cetirizine (ZYRTEC) 10 MG tablet Take 1 tablet (10 mg total) by mouth daily., Starting Tue 03/02/2016, Print             Ernestene KielJami L Olympia HeightsHagler, PA-C 03/02/16 1423    Jennye MoccasinBrian S Quigley, MD 03/02/16 1511

## 2016-03-02 NOTE — ED Triage Notes (Signed)
Patient presents to ED via POV with c/o bilateral ear pain since last night. Patient states, "I think I have an ear infection".

## 2016-07-27 ENCOUNTER — Encounter: Payer: Self-pay | Admitting: *Deleted

## 2016-07-27 ENCOUNTER — Emergency Department
Admission: EM | Admit: 2016-07-27 | Discharge: 2016-07-27 | Disposition: A | Discharge status: Home / Self Care | Payer: Medicaid Other | Attending: Student in an Organized Health Care Education/Training Program | Admitting: Student in an Organized Health Care Education/Training Program

## 2016-07-27 DIAGNOSIS — F17200 Nicotine dependence, unspecified, uncomplicated: Secondary | ICD-10-CM | POA: Insufficient documentation

## 2016-07-27 DIAGNOSIS — J45909 Unspecified asthma, uncomplicated: Secondary | ICD-10-CM | POA: Insufficient documentation

## 2016-07-27 DIAGNOSIS — Z79899 Other long term (current) drug therapy: Secondary | ICD-10-CM | POA: Insufficient documentation

## 2016-07-27 DIAGNOSIS — N3001 Acute cystitis with hematuria: Secondary | ICD-10-CM | POA: Insufficient documentation

## 2016-07-27 LAB — URINALYSIS, ROUTINE W REFLEX MICROSCOPIC: SPECIFIC GRAVITY, URINE: 1.022 (ref 1.005–1.030)

## 2016-07-27 LAB — POCT PREGNANCY, URINE: Preg Test, Ur: NEGATIVE

## 2016-07-27 MED ORDER — CEPHALEXIN 500 MG PO CAPS
ORAL_CAPSULE | ORAL | Status: AC
  Filled 2016-07-27: qty 1

## 2016-07-27 MED ORDER — CEPHALEXIN 500 MG PO CAPS: 500.0000 mg | ORAL_CAPSULE | Freq: Three times a day (TID) | ORAL | 0 refills | Status: AC

## 2016-07-27 MED ORDER — CEPHALEXIN 500 MG PO CAPS
500.0000 mg | ORAL_CAPSULE | Freq: Once | ORAL | Status: AC
  Administered 2016-07-27: 500 mg via ORAL

## 2016-07-27 NOTE — ED Triage Notes (Addendum)
Pt reports urinary frequency and vaginal pain.  No vag bleeding  No abd pain.  No dysuria.  No back pain.  Pt alert.  Pt started azo today.

## 2016-07-27 NOTE — ED Provider Notes (Signed)
[facility] Emergency Department Provider Note    None    (approximate)  I have reviewed the triage vital signs and the nursing notes.   HISTORY  Chief Complaint Urinary Frequency    HPI Kayla Mcgee is a 36 y.o. female who presents from home  with increased urinary frequency and dysuria that started yesterday. Progressively worsened throughout the day. Pain is 10 out of 10 when she is urinating. No fevers. No flank pain. Has had a history of UTIs and states that this feels identical. Denies any nausea or vomiting. She took Azo today without any improvement in symptoms.   Past Medical History:  Diagnosis Date  . Anxiety   . Asthma   . Panic attacks   . Seasonal allergies    No family history on file. Past Surgical History:  Procedure Laterality Date  . CESAREAN SECTION    . TONSILLECTOMY     There are no active problems to display for this patient.     Prior to Admission medications   Medication Sig Start Date End Date Taking? Authorizing Provider  albuterol (PROVENTIL HFA;VENTOLIN HFA) 108 (90 BASE) MCG/ACT inhaler Inhale 2 puffs into the lungs every 6 (six) hours as needed for wheezing or shortness of breath. 02/19/15   Menshew, Charlesetta Ivory, PA-C  azithromycin (ZITHROMAX Z-PAK) 250 MG tablet Take 2 tablets (500 mg) on  Day 1,  followed by 1 tablet (250 mg) once daily on Days 2 through 5. 01/06/16   Tommi Rumps, PA-C  cephALEXin (KEFLEX) 500 MG capsule Take 1 capsule (500 mg total) by mouth 3 (three) times daily. 07/27/16 08/03/16  Willy Eddy, MD  cetirizine (ZYRTEC) 10 MG tablet Take 1 tablet (10 mg total) by mouth daily. 03/02/16   Hagler, Jami L, PA-C  predniSONE (DELTASONE) 5 MG tablet Take 6 tablets (30 mg total) by mouth daily with breakfast. May take for up to 5 days.  Do not take any NSAIDs with this medication. Take with food. 03/02/16   Hagler, Jami L, PA-C  ranitidine (ZANTAC) 150 MG tablet Take 1 tablet (150 mg  total) by mouth 2 (two) times daily. 01/06/16 01/05/17  Tommi Rumps, PA-C    Allergies Patient has no known allergies.    Social History Social History  Substance Use Topics  . Smoking status: Current Every Day Smoker    Packs/day: 0.50  . Smokeless tobacco: Never Used  . Alcohol use No    Review of Systems Patient denies headaches, rhinorrhea, blurry vision, numbness, shortness of breath, chest pain, edema, cough, abdominal pain, nausea, vomiting, diarrhea, dysuria, fevers, rashes or hallucinations unless otherwise stated above in HPI. ____________________________________________   PHYSICAL EXAM:  VITAL SIGNS: Vitals:   07/27/16 2005 07/27/16 2007  BP:  128/78  Pulse: 92   Resp: (!) 22   Temp: 98.6 F (37 C)     Constitutional: Alert and oriented. Well appearing and in no acute distress. Eyes: Conjunctivae are normal. PERRL. EOMI. Head: Atraumatic. Nose: No congestion/rhinnorhea. Mouth/Throat: Mucous membranes are moist.  Oropharynx non-erythematous. Neck: No stridor. Painless ROM. No cervical spine tenderness to palpation Hematological/Lymphatic/Immunilogical: No cervical lymphadenopathy. Cardiovascular: Normal rate, regular rhythm. Grossly normal heart sounds.  Good peripheral circulation. Respiratory: Normal respiratory effort.  No retractions. Lungs CTAB. Gastrointestinal: Soft and nontender. No distention. No abdominal bruits. No CVA tenderness. Genitourinary:  Musculoskeletal: No lower extremity tenderness nor edema.  No joint effusions. Neurologic:  Normal speech and language. No gross focal neurologic deficits  are appreciated. No gait instability. Skin:  Skin is warm, dry and intact. No rash noted. Psychiatric: Mood and affect are normal. Speech and behavior are normal.  ____________________________________________   LABS (all labs ordered are listed, but only abnormal results are displayed)  Results for orders placed or performed during the  hospital encounter of 07/27/16 (from the past 24 hour(s))  Urinalysis, Routine w reflex microscopic     Status: Abnormal   Collection Time: 07/27/16  8:07 PM  Result Value Ref Range   Color, Urine ORANGE (A) YELLOW   APPearance HAZY (A) CLEAR   Specific Gravity, Urine 1.022 1.005 - 1.030   pH  5.0 - 8.0    TEST NOT REPORTED DUE TO COLOR INTERFERENCE OF URINE PIGMENT   Glucose, UA (A) NEGATIVE mg/dL    TEST NOT REPORTED DUE TO COLOR INTERFERENCE OF URINE PIGMENT   Hgb urine dipstick (A) NEGATIVE    TEST NOT REPORTED DUE TO COLOR INTERFERENCE OF URINE PIGMENT   Bilirubin Urine (A) NEGATIVE    TEST NOT REPORTED DUE TO COLOR INTERFERENCE OF URINE PIGMENT   Ketones, ur (A) NEGATIVE mg/dL    TEST NOT REPORTED DUE TO COLOR INTERFERENCE OF URINE PIGMENT   Protein, ur (A) NEGATIVE mg/dL    TEST NOT REPORTED DUE TO COLOR INTERFERENCE OF URINE PIGMENT   Nitrite (A) NEGATIVE    TEST NOT REPORTED DUE TO COLOR INTERFERENCE OF URINE PIGMENT   Leukocytes, UA (A) NEGATIVE    TEST NOT REPORTED DUE TO COLOR INTERFERENCE OF URINE PIGMENT   RBC / HPF 0-5 0 - 5 RBC/hpf   WBC, UA TOO NUMEROUS TO COUNT 0 - 5 WBC/hpf   Bacteria, UA RARE (A) NONE SEEN   Squamous Epithelial / LPF 0-5 (A) NONE SEEN   Mucous PRESENT   Pregnancy, urine POC     Status: None   Collection Time: 07/27/16  8:15 PM  Result Value Ref Range   Preg Test, Ur NEGATIVE NEGATIVE   ____________________________________________  EKG____________________________________________   PROCEDURES  Procedure(s) performed:  Procedures    Critical Care performed: no ____________________________________________   INITIAL IMPRESSION / ASSESSMENT AND PLAN / ED COURSE  Pertinent labs & imaging results that were available during my care of the patient were reviewed by me and considered in my medical decision making (see chart for details).  DDX: cystitis, pyelo, pid, vaginosis  [name] is a 36 y.o. who presents to the ED with  Evidence of acute cystitis with hematuria. Not consistent with pyelonephritis. Patient able to tolerate oral hydration. Her abdominal exam is soft and benign. Will provide prescription for Keflex and give dose here. We'll send urine for culture.  Have discussed with the patient and available family all diagnostics and treatments performed thus far and all questions were answered to the best of my ability. The patient demonstrates understanding and agreement with plan.       ____________________________________________   FINAL CLINICAL IMPRESSION(S) / ED DIAGNOSES  Final diagnoses:  Acute cystitis with hematuria      NEW MEDICATIONS STARTED DURING THIS VISIT:  Discharge Medication List as of 07/27/2016  9:52 PM    START taking these medications   Details  cephALEXin (KEFLEX) 500 MG capsule Take 1 capsule (500 mg total) by mouth 3 (three) times daily., Starting Tue 07/27/2016, Until Tue 08/03/2016, Print         Note:  This document was prepared using Dragon voice recognition software and may include unintentional dictation errors.  Willy Eddy, MD 07/27/16 [phone]

## 2016-09-27 ENCOUNTER — Encounter: Payer: Self-pay | Admitting: *Deleted

## 2016-09-27 DIAGNOSIS — Z5321 Procedure and treatment not carried out due to patient leaving prior to being seen by health care provider: Secondary | ICD-10-CM | POA: Insufficient documentation

## 2016-09-27 DIAGNOSIS — R109 Unspecified abdominal pain: Secondary | ICD-10-CM | POA: Insufficient documentation

## 2016-09-27 LAB — BASIC METABOLIC PANEL
ANION GAP: 8 (ref 5–15)
BUN: 17 mg/dL (ref 6–20)
CHLORIDE: 105 mmol/L (ref 101–111)
CO2: 23 mmol/L (ref 22–32)
CREATININE: 0.68 mg/dL (ref 0.44–1.00)
Calcium: 8.8 mg/dL — ABNORMAL LOW (ref 8.9–10.3)
GFR calc non Af Amer: >60 mL/min (ref >60)
Glucose, Bld: 131 mg/dL — ABNORMAL HIGH (ref 65–99)
Potassium: 4 mmol/L (ref 3.5–5.1)
SODIUM: 136 mmol/L (ref 135–145)

## 2016-09-27 LAB — URINALYSIS, COMPLETE (UACMP) WITH MICROSCOPIC
BACTERIA UA: NONE SEEN
BILIRUBIN URINE: NEGATIVE
Glucose, UA: NEGATIVE mg/dL
Hgb urine dipstick: NEGATIVE
Ketones, ur: NEGATIVE mg/dL
Nitrite: NEGATIVE
PH: 5 (ref 5.0–8.0)
Protein, ur: NEGATIVE mg/dL
SPECIFIC GRAVITY, URINE: 1.025 (ref 1.005–1.030)

## 2016-09-27 LAB — CBC
HCT: 36.4 % (ref 35.0–47.0)
HEMOGLOBIN: 11.8 g/dL — AB (ref 12.0–16.0)
MCH: 23.9 pg — ABNORMAL LOW (ref 26.0–34.0)
MCHC: 32.4 g/dL (ref 32.0–36.0)
MCV: 73.7 fL — AB (ref 80.0–100.0)
PLATELETS: 261 10*3/uL (ref 150–440)
RBC: 4.94 MIL/uL (ref 3.80–5.20)
RDW: 16.7 % — ABNORMAL HIGH (ref 11.5–14.5)
WBC: 18.4 10*3/uL — AB (ref 3.6–11.0)

## 2016-09-27 LAB — POCT PREGNANCY, URINE: PREG TEST UR: NEGATIVE

## 2016-09-27 NOTE — ED Triage Notes (Signed)
Pt reports having a UTI 10 days ago, pt reports sudden onset of right flank pain today with nausea and vomiting, pt vomited 5 times today

## 2016-09-28 ENCOUNTER — Emergency Department
Admission: EM | Admit: 2016-09-28 | Discharge: 2016-09-28 | Disposition: A | Discharge status: Home / Self Care | Payer: Self-pay | Attending: Emergency Medicine | Admitting: Emergency Medicine

## 2016-09-29 ENCOUNTER — Encounter: Payer: Self-pay | Admitting: Intensive Care

## 2016-09-29 ENCOUNTER — Emergency Department
Admission: EM | Admit: 2016-09-29 | Discharge: 2016-09-29 | Disposition: A | Discharge status: Home / Self Care | Payer: Self-pay | Attending: Emergency Medicine | Admitting: Emergency Medicine

## 2016-09-29 DIAGNOSIS — Z79899 Other long term (current) drug therapy: Secondary | ICD-10-CM | POA: Insufficient documentation

## 2016-09-29 DIAGNOSIS — J45909 Unspecified asthma, uncomplicated: Secondary | ICD-10-CM | POA: Insufficient documentation

## 2016-09-29 DIAGNOSIS — F17210 Nicotine dependence, cigarettes, uncomplicated: Secondary | ICD-10-CM | POA: Insufficient documentation

## 2016-09-29 DIAGNOSIS — N390 Urinary tract infection, site not specified: Secondary | ICD-10-CM | POA: Insufficient documentation

## 2016-09-29 LAB — CBC WITH DIFFERENTIAL/PLATELET
BASOS ABS: 0 10*3/uL (ref 0–0.1)
Basophils Relative: 0 %
EOS PCT: 2 %
Eosinophils Absolute: 0.2 10*3/uL (ref 0–0.7)
HEMATOCRIT: 29.6 % — AB (ref 35.0–47.0)
Hemoglobin: 9.9 g/dL — ABNORMAL LOW (ref 12.0–16.0)
LYMPHS PCT: 25 %
Lymphs Abs: 2.5 10*3/uL (ref 1.0–3.6)
MCH: 24.5 pg — AB (ref 26.0–34.0)
MCHC: 33.5 g/dL (ref 32.0–36.0)
MCV: 73 fL — AB (ref 80.0–100.0)
MONOS PCT: 8 %
Monocytes Absolute: 0.8 10*3/uL (ref 0.2–0.9)
Neutro Abs: 6.4 10*3/uL (ref 1.4–6.5)
Neutrophils Relative %: 65 %
PLATELETS: 206 10*3/uL (ref 150–440)
RBC: 4.05 MIL/uL (ref 3.80–5.20)
RDW: 16.8 % — AB (ref 11.5–14.5)
WBC: 10 10*3/uL (ref 3.6–11.0)

## 2016-09-29 LAB — URINALYSIS, COMPLETE (UACMP) WITH MICROSCOPIC
BACTERIA UA: NONE SEEN
Bilirubin Urine: NEGATIVE
GLUCOSE, UA: NEGATIVE mg/dL
HGB URINE DIPSTICK: NEGATIVE
Ketones, ur: NEGATIVE mg/dL
NITRITE: POSITIVE — AB
PH: 5 (ref 5.0–8.0)
PROTEIN: NEGATIVE mg/dL
Specific Gravity, Urine: 1.02 (ref 1.005–1.030)

## 2016-09-29 LAB — COMPREHENSIVE METABOLIC PANEL
ALT: 18 U/L (ref 14–54)
ANION GAP: 4 — AB (ref 5–15)
AST: 18 U/L (ref 15–41)
Albumin: 3.2 g/dL — ABNORMAL LOW (ref 3.5–5.0)
Alkaline Phosphatase: 73 U/L (ref 38–126)
BILIRUBIN TOTAL: 0.3 mg/dL (ref 0.3–1.2)
BUN: 10 mg/dL (ref 6–20)
CHLORIDE: 108 mmol/L (ref 101–111)
CO2: 26 mmol/L (ref 22–32)
Calcium: 8.2 mg/dL — ABNORMAL LOW (ref 8.9–10.3)
Creatinine, Ser: 0.66 mg/dL (ref 0.44–1.00)
Glucose, Bld: 112 mg/dL — ABNORMAL HIGH (ref 65–99)
POTASSIUM: 3.9 mmol/L (ref 3.5–5.1)
Sodium: 138 mmol/L (ref 135–145)
TOTAL PROTEIN: 6.5 g/dL (ref 6.5–8.1)

## 2016-09-29 MED ORDER — ONDANSETRON HCL 4 MG/2ML IJ SOLN
4.0000 mg | Freq: Once | INTRAMUSCULAR | Status: AC
  Administered 2016-09-29: 4 mg via INTRAVENOUS

## 2016-09-29 MED ORDER — DEXTROSE 5 % IV SOLN
1.0000 g | INTRAVENOUS | Status: DC
  Administered 2016-09-29: 1 g via INTRAVENOUS
  Filled 2016-09-29: qty 10

## 2016-09-29 MED ORDER — KETOROLAC TROMETHAMINE 10 MG PO TABS: 10.0000 mg | ORAL_TABLET | Freq: Three times a day (TID) | ORAL | 0 refills | Status: DC

## 2016-09-29 MED ORDER — METOCLOPRAMIDE HCL 10 MG PO TABS: 5.0000 mg | ORAL_TABLET | Freq: Three times a day (TID) | ORAL | 0 refills | Status: DC | PRN

## 2016-09-29 MED ORDER — CEPHALEXIN 500 MG PO CAPS: 500.0000 mg | ORAL_CAPSULE | Freq: Two times a day (BID) | ORAL | 0 refills | Status: AC

## 2016-09-29 MED ORDER — SODIUM CHLORIDE 0.9 % IV BOLUS (SEPSIS)
1000.0000 mL | Freq: Once | INTRAVENOUS | Status: AC
  Administered 2016-09-29: 1000 mL via INTRAVENOUS

## 2016-09-29 MED ORDER — KETOROLAC TROMETHAMINE 30 MG/ML IJ SOLN
30.0000 mg | Freq: Once | INTRAMUSCULAR | Status: AC
  Administered 2016-09-29: 30 mg via INTRAVENOUS
  Filled 2016-09-29: qty 1

## 2016-09-29 MED ORDER — ONDANSETRON HCL 4 MG/2ML IJ SOLN
INTRAMUSCULAR | Status: AC
  Filled 2016-09-29: qty 2

## 2016-09-29 NOTE — ED Notes (Signed)
Discussed discharge instructions, prescriptions, and follow-up care with patient. No questions or concerns at this time. Pt stable at discharge.  

## 2016-09-29 NOTE — ED Provider Notes (Signed)
[facility] Emergency Department Provider Note ____________________________________________  Time seen: 1518  I have reviewed the triage vital signs and the nursing notes.  HISTORY  Chief Complaint  Back Pain (lower)  HPI Kayla Mcgee is a 36 y.o. female presents to the ED with complaints of bilateral flank pain is worse on the right. Patient describes being diagnosed with a UTI yesterday at [facility] clinic. She was started on Levaquin for her daily antibiotic dose. She describes that the pain has worsened over the last 24 hours. She also reports increased pain to the flank when coughing or walking. She denies any frank hematuria, dysuria, or urinary retention. She was initially set to be evaluated here on Monday, but left prior to being evaluated secondary to the protracted wait. She does admit that labs were drawn and urine was collected. She followed up the next day at [facility] clinic and was confirmed to have UTI. She also notes that they gave her an IM dose of Rocephin and Toradol. She also has had some intermittent nausea with vomiting and some subjective fevers with chills. She reports a history of recurrent UTIs. She denies any history of kidney stones.  Past Medical History:  Diagnosis Date  . Anxiety   . Asthma   . Panic attacks   . Seasonal allergies     There are no active problems to display for this patient.   Past Surgical History:  Procedure Laterality Date  . CESAREAN SECTION    . TONSILLECTOMY      Prior to Admission medications   Medication Sig Start Date End Date Taking? Authorizing Provider  albuterol (PROVENTIL HFA;VENTOLIN HFA) 108 (90 BASE) MCG/ACT inhaler Inhale 2 puffs into the lungs every 6 (six) hours as needed for wheezing or shortness of breath. 02/19/15   Reesa Gotschall, Charlesetta Ivory, PA-C  azithromycin (ZITHROMAX Z-PAK) 250 MG tablet Take 2 tablets (500 mg) on  Day 1,  followed by 1 tablet (250 mg) once daily on Days 2 through 5.  01/06/16   Tommi Rumps, PA-C  cephALEXin (KEFLEX) 500 MG capsule Take 1 capsule (500 mg total) by mouth 2 (two) times daily. 09/29/16 10/06/16  Kataleah Bejar, Charlesetta Ivory, PA-C  cetirizine (ZYRTEC) 10 MG tablet Take 1 tablet (10 mg total) by mouth daily. 03/02/16   Hagler, Jami L, PA-C  ketorolac (TORADOL) 10 MG tablet Take 1 tablet (10 mg total) by mouth every 8 (eight) hours. 09/29/16   Vannary Greening, Charlesetta Ivory, PA-C  metoCLOPramide (REGLAN) 10 MG tablet Take 0.5 tablets (5 mg total) by mouth every 8 (eight) hours as needed for nausea or vomiting. 09/29/16 10/04/16  Malita Ignasiak, Charlesetta Ivory, PA-C  predniSONE (DELTASONE) 5 MG tablet Take 6 tablets (30 mg total) by mouth daily with breakfast. May take for up to 5 days.  Do not take any NSAIDs with this medication. Take with food. 03/02/16   Hagler, Jami L, PA-C  ranitidine (ZANTAC) 150 MG tablet Take 1 tablet (150 mg total) by mouth 2 (two) times daily. 01/06/16 01/05/17  Tommi Rumps, PA-C    Allergies Patient has no known allergies.  History reviewed. No pertinent family history.  Social History Social History  Substance Use Topics  . Smoking status: Current Every Day Smoker    Packs/day: 0.50  . Smokeless tobacco: Never Used  . Alcohol use No    Review of Systems  Constitutional: Negative for fever. Cardiovascular: Negative for chest pain. Respiratory: Negative for shortness of breath. Gastrointestinal: Negative  for abdominal pain, vomiting and diarrhea. Reports flank pain. Genitourinary: Positive for dysuria. Musculoskeletal: Negative for back pain. Skin: Negative for rash. Neurological: Negative for headaches, focal weakness or numbness. ____________________________________________  PHYSICAL EXAM:  VITAL SIGNS: ED Triage Vitals  Enc Vitals Group     BP 09/29/16 1357 122/75     Pulse Rate 09/29/16 1357 83     Resp 09/29/16 1357 16     Temp 09/29/16 1357 98.3 F (36.8 C)     Temp Source 09/29/16 1357 Oral     SpO2  09/29/16 1357 98 %     Weight 09/29/16 1358 222 lb (100.7 kg)     Height 09/29/16 1358 5\' 6"  (1.676 m)     Head Circumference --      Peak Flow --      Pain Score 09/29/16 1359 6     Pain Loc --      Pain Edu? --      Excl. in GC? --     Constitutional: Alert and oriented. Well appearing and in no distress. Head: Normocephalic and atraumatic. Cardiovascular: Normal rate, regular rhythm. Normal distal pulses. Respiratory: Normal respiratory effort. No wheezes/rales/rhonchi. Gastrointestinal: Soft and nontender. No distention, rebound, guarding, or rigidity. Positive R>L CVA tenderness.  Musculoskeletal: Nontender with normal range of motion in all extremities.  Neurologic:  Normal gait without ataxia. Normal speech and language. No gross focal neurologic deficits are appreciated. Skin:  Skin is warm, dry and intact. No rash noted. Psychiatric: Mood and affect are normal. Patient exhibits appropriate insight and judgment. ____________________________________________   LABS (pertinent positives/negative)  Labs Reviewed  URINALYSIS, COMPLETE (UACMP) WITH MICROSCOPIC - Abnormal; Notable for the following:       Result Value   Color, Urine AMBER (*)    APPearance HAZY (*)    Nitrite POSITIVE (*)    Leukocytes, UA SMALL (*)    Squamous Epithelial / LPF 6-30 (*)    All other components within normal limits  CBC WITH DIFFERENTIAL/PLATELET - Abnormal; Notable for the following:    Hemoglobin 9.9 (*)    HCT 29.6 (*)    MCV 73.0 (*)    MCH 24.5 (*)    RDW 16.8 (*)    All other components within normal limits  COMPREHENSIVE METABOLIC PANEL - Abnormal; Notable for the following:    Glucose, Bld 112 (*)    Calcium 8.2 (*)    Albumin 3.2 (*)    Anion gap 4 (*)    All other components within normal limits  URINE CULTURE  ____________________________________________  PROCEDURES  NS 1000 ml bolus Toradol 30 mg IVP Zofran 4 mg IVP Rocephin 1 g  IVPB ____________________________________________  INITIAL IMPRESSION / ASSESSMENT AND PLAN / ED COURSE  Patient presents to the ED with increasing low back pain and right flank pain. She is 2 days into a course of Levaquin for an acute UTI. Her symptoms have sharply worsened prompting her to report to the ED. She is found today to have an elevated number of white blood cells in the urine. Her blood labs do, however, show a decrease in her overall leukocytosis, seen 2 days prior. She reports improvement in her pain at the time of this disposition. She will be discharged with a prescription for Keflex & Reglan to dose as directed. She will follow up with [facility] for further evaluation, interim follow-up, and test of cure. Urine culture is pending at the time of discharge. Return precautions are reviewed. ____________________________________________  FINAL CLINICAL IMPRESSION(S) / ED DIAGNOSES  Final diagnoses:  Lower urinary tract infectious disease      Lissa HoardMenshew, Tj Kitchings V Bacon, PA-C 09/29/16 1821    Emily FilbertWilliams, Jonathan E, MD 10/02/16 1500

## 2016-09-29 NOTE — Discharge Instructions (Signed)
You have been tested and initial treatment has been provided for your UTI. You should stop the previously prescribed antibiotic (Levofloxacin) and start the Keflex tomorrow. Take the nausea medicine as needed and the pain medicine as directed. You should follow-up with [facility] for continued symptoms. Return to the ED for worsening symptoms.

## 2016-09-29 NOTE — ED Triage Notes (Signed)
Patients today with lower back pain that is worse on right side. Reports she was diagnosed with a UTI yesterday and has started taking her antibiotics but the pain has gotten worse. C/o extreme pain when coughing or ambulating. A&O x4

## 2016-10-01 LAB — URINE CULTURE
CULTURE: NO GROWTH
SPECIAL REQUESTS: NORMAL

## 2017-05-08 ENCOUNTER — Emergency Department
Admission: EM | Admit: 2017-05-08 | Discharge: 2017-05-08 | Discharge status: Against Medical Advice | Payer: Medicaid Other | Attending: Emergency Medicine | Admitting: Emergency Medicine

## 2017-05-08 ENCOUNTER — Encounter: Payer: Self-pay | Admitting: *Deleted

## 2017-05-08 ENCOUNTER — Other Ambulatory Visit: Payer: Self-pay

## 2017-05-08 DIAGNOSIS — R1013 Epigastric pain: Secondary | ICD-10-CM | POA: Diagnosis not present

## 2017-05-08 DIAGNOSIS — Z5321 Procedure and treatment not carried out due to patient leaving prior to being seen by health care provider: Secondary | ICD-10-CM | POA: Diagnosis not present

## 2017-05-08 HISTORY — DX: Other specified bacterial intestinal infections: A04.8

## 2017-05-08 LAB — COMPREHENSIVE METABOLIC PANEL
ALT: 14 U/L (ref 14–54)
ANION GAP: 7 (ref 5–15)
AST: 22 U/L (ref 15–41)
Albumin: 3.9 g/dL (ref 3.5–5.0)
Alkaline Phosphatase: 85 U/L (ref 38–126)
BILIRUBIN TOTAL: 0.4 mg/dL (ref 0.3–1.2)
BUN: 12 mg/dL (ref 6–20)
CO2: 22 mmol/L (ref 22–32)
Calcium: 8.8 mg/dL — ABNORMAL LOW (ref 8.9–10.3)
Chloride: 107 mmol/L (ref 101–111)
Creatinine, Ser: 0.54 mg/dL (ref 0.44–1.00)
GFR calc Af Amer: >60 mL/min (ref >60)
GFR calc non Af Amer: >60 mL/min (ref >60)
GLUCOSE: 134 mg/dL — AB (ref 65–99)
POTASSIUM: 3.6 mmol/L (ref 3.5–5.1)
Sodium: 136 mmol/L (ref 135–145)
Total Protein: 7.7 g/dL (ref 6.5–8.1)

## 2017-05-08 LAB — URINALYSIS, COMPLETE (UACMP) WITH MICROSCOPIC
BACTERIA UA: NONE SEEN
BILIRUBIN URINE: NEGATIVE
Glucose, UA: NEGATIVE mg/dL
HGB URINE DIPSTICK: NEGATIVE
Ketones, ur: NEGATIVE mg/dL
Leukocytes, UA: NEGATIVE
Nitrite: NEGATIVE
PROTEIN: NEGATIVE mg/dL
SPECIFIC GRAVITY, URINE: 1.012 (ref 1.005–1.030)
pH: 6 (ref 5.0–8.0)

## 2017-05-08 LAB — TROPONIN I

## 2017-05-08 LAB — CBC
HEMATOCRIT: 32.8 % — AB (ref 35.0–47.0)
HEMOGLOBIN: 10.6 g/dL — AB (ref 12.0–16.0)
MCH: 23 pg — ABNORMAL LOW (ref 26.0–34.0)
MCHC: 32.3 g/dL (ref 32.0–36.0)
MCV: 71.3 fL — AB (ref 80.0–100.0)
Platelets: 211 10*3/uL (ref 150–440)
RBC: 4.6 MIL/uL (ref 3.80–5.20)
RDW: 17.3 % — AB (ref 11.5–14.5)
WBC: 7.3 10*3/uL (ref 3.6–11.0)

## 2017-05-08 LAB — LIPASE, BLOOD: LIPASE: 49 U/L (ref 11–51)

## 2017-05-08 NOTE — ED Triage Notes (Signed)
Pt c/o misternal chest pain that is reproducible w/ palpation on epiigastrum. Pt in no observable acute distress at this time. Pt being treated for h. Pylori w/ 2 more days of abx to take. Pt c/o nausea, denies vomiting and diarrhea.

## 2017-05-08 NOTE — ED Notes (Signed)
Patient unable to stay due to needing to take her daughter home for flu restrictions.  Patient will return after taking daughter home.

## 2017-05-18 DIAGNOSIS — D509 Iron deficiency anemia, unspecified: Secondary | ICD-10-CM | POA: Insufficient documentation

## 2017-05-18 DIAGNOSIS — R1013 Epigastric pain: Secondary | ICD-10-CM | POA: Insufficient documentation

## 2017-05-18 DIAGNOSIS — A048 Other specified bacterial intestinal infections: Secondary | ICD-10-CM | POA: Insufficient documentation

## 2017-05-18 DIAGNOSIS — D500 Iron deficiency anemia secondary to blood loss (chronic): Secondary | ICD-10-CM | POA: Insufficient documentation

## 2017-06-09 ENCOUNTER — Other Ambulatory Visit: Payer: Self-pay

## 2017-06-09 ENCOUNTER — Emergency Department
Admission: EM | Admit: 2017-06-09 | Discharge: 2017-06-09 | Disposition: A | Discharge status: Home / Self Care | Payer: Medicaid Other | Attending: Emergency Medicine | Admitting: Emergency Medicine

## 2017-06-09 DIAGNOSIS — Z79899 Other long term (current) drug therapy: Secondary | ICD-10-CM | POA: Diagnosis not present

## 2017-06-09 DIAGNOSIS — J0100 Acute maxillary sinusitis, unspecified: Secondary | ICD-10-CM | POA: Diagnosis not present

## 2017-06-09 DIAGNOSIS — F17200 Nicotine dependence, unspecified, uncomplicated: Secondary | ICD-10-CM | POA: Diagnosis not present

## 2017-06-09 DIAGNOSIS — J45909 Unspecified asthma, uncomplicated: Secondary | ICD-10-CM | POA: Diagnosis not present

## 2017-06-09 DIAGNOSIS — R5381 Other malaise: Secondary | ICD-10-CM | POA: Diagnosis present

## 2017-06-09 LAB — GROUP A STREP BY PCR: Group A Strep by PCR: NOT DETECTED

## 2017-06-09 MED ORDER — AMOXICILLIN-POT CLAVULANATE 875-125 MG PO TABS: 1.0000 | ORAL_TABLET | Freq: Two times a day (BID) | ORAL | 0 refills | Status: AC

## 2017-06-09 MED ORDER — DYCLONINE-GLYCERIN 0.1-33 % MT LIQD: 1.0000 | Freq: Two times a day (BID) | OROMUCOSAL | 0 refills | Status: AC | PRN

## 2017-06-09 NOTE — ED Provider Notes (Signed)
[facility] Emergency Department Provider Note  ____________________________________________  Time seen: Approximately 9:34 PM  I have reviewed the triage vital signs and the nursing notes.   HISTORY  Chief Complaint Sore Throat    HPI Kayla Mcgee is a 37 y.o. female presents to the emergency department with maxillary and frontal sinus tenderness, purulent rhinorrhea, malaise and pharyngitis.  Patient reports that she has experienced symptoms for the past 3-4 days.  Patient reports that she experiences multiple episodes of sinusitis yearly.  Patient reports that her current symptoms feel like symptoms she has experienced in the past.  No alleviating measures have been attempted.   Past Medical History:  Diagnosis Date  . Anxiety   . Asthma   . H. pylori infection   . Panic attacks   . Seasonal allergies     There are no active problems to display for this patient.   Past Surgical History:  Procedure Laterality Date  . CESAREAN SECTION    . TONSILLECTOMY      Prior to Admission medications   Medication Sig Start Date End Date Taking? Authorizing Provider  albuterol (PROVENTIL HFA;VENTOLIN HFA) 108 (90 BASE) MCG/ACT inhaler Inhale 2 puffs into the lungs every 6 (six) hours as needed for wheezing or shortness of breath. 02/19/15   Menshew, Charlesetta Ivory, PA-C  amoxicillin-clavulanate (AUGMENTIN) 875-125 MG tablet Take 1 tablet by mouth 2 (two) times daily for 10 days. 06/09/17 06/19/17  Orvil Feil, PA-C  azithromycin (ZITHROMAX Z-PAK) 250 MG tablet Take 2 tablets (500 mg) on  Day 1,  followed by 1 tablet (250 mg) once daily on Days 2 through 5. 01/06/16   Tommi Rumps, PA-C  cetirizine (ZYRTEC) 10 MG tablet Take 1 tablet (10 mg total) by mouth daily. 03/02/16   Hagler, Jami L, PA-C  Dyclonine-Glycerin (CEPACOL SORE THROAT SPRAY) 0.1-33 % LIQD Use as directed 1 spray in the mouth or throat 2 (two) times daily as needed for up to 5 days.  06/09/17 06/14/17  Orvil Feil, PA-C  ketorolac (TORADOL) 10 MG tablet Take 1 tablet (10 mg total) by mouth every 8 (eight) hours. 09/29/16   Menshew, Charlesetta Ivory, PA-C  metoCLOPramide (REGLAN) 10 MG tablet Take 0.5 tablets (5 mg total) by mouth every 8 (eight) hours as needed for nausea or vomiting. 09/29/16 10/04/16  Menshew, Charlesetta Ivory, PA-C  predniSONE (DELTASONE) 5 MG tablet Take 6 tablets (30 mg total) by mouth daily with breakfast. May take for up to 5 days.  Do not take any NSAIDs with this medication. Take with food. 03/02/16   Hagler, Jami L, PA-C  ranitidine (ZANTAC) 150 MG tablet Take 1 tablet (150 mg total) by mouth 2 (two) times daily. 01/06/16 01/05/17  Tommi Rumps, PA-C    Allergies Patient has no known allergies.  No family history on file.  Social History Social History   Tobacco Use  . Smoking status: Current Every Day Smoker    Packs/day: 0.50  . Smokeless tobacco: Never Used  Substance Use Topics  . Alcohol use: No  . Drug use: No     Review of Systems  Constitutional: No fever/chills Eyes: No visual changes. No discharge ENT: Patient has maxillary and frontal sinus tenderness.  Patient has purulent rhinorrhea. Cardiovascular: no chest pain. Respiratory: no cough. No SOB. Gastrointestinal: No abdominal pain.  No nausea, no vomiting.  No diarrhea.  No constipation. Musculoskeletal: Negative for musculoskeletal pain. Skin: Negative for rash, abrasions, lacerations,  ecchymosis. Neurological: Negative for headaches, focal weakness or numbness.   ____________________________________________   PHYSICAL EXAM:  VITAL SIGNS: ED Triage Vitals  Enc Vitals Group     BP 06/09/17 1921 113/72     Pulse Rate 06/09/17 1921 72     Resp 06/09/17 1921 20     Temp 06/09/17 1921 98.6 F (37 C)     Temp Source 06/09/17 1921 Oral     SpO2 06/09/17 1921 100 %     Weight 06/09/17 1920 208 lb (94.3 kg)     Height 06/09/17 1920 5\' 6"  (1.676 m)     Head  Circumference --      Peak Flow --      Pain Score 06/09/17 1920 0     Pain Loc --      Pain Edu? --      Excl. in GC? --      Constitutional: Alert and oriented. Well appearing and in no acute distress. Eyes: Conjunctivae are normal. PERRL. EOMI. Head: Atraumatic.  Patient has maxillary and frontal sinus tenderness. ENT:      Ears: TMs are pearly bilaterally.      Nose: No congestion/rhinnorhea.      Mouth/Throat: Mucous membranes are moist.  Neck: No stridor.  No cervical spine tenderness to palpation. Cardiovascular: Normal rate, regular rhythm. Normal S1 and S2.  Good peripheral circulation. Respiratory: Normal respiratory effort without tachypnea or retractions. Lungs CTAB. Good air entry to the bases with no decreased or absent breath sounds. Gastrointestinal: Bowel sounds 4 quadrants. Soft and nontender to palpation. No guarding or rigidity. No palpable masses. No distention. No CVA tenderness. Musculoskeletal: Full range of motion to all extremities. No gross deformities appreciated. Neurologic:  Normal speech and language. No gross focal neurologic deficits are appreciated.  Skin:  Skin is warm, dry and intact. No rash noted. Psychiatric: Mood and affect are normal. Speech and behavior are normal. Patient exhibits appropriate insight and judgement.   ____________________________________________   LABS (all labs ordered are listed, but only abnormal results are displayed)  Labs Reviewed  GROUP A STREP BY PCR   ____________________________________________  EKG   ____________________________________________  RADIOLOGY   No results found.  ____________________________________________    PROCEDURES  Procedure(s) performed:    Procedures    Medications - No data to display   ____________________________________________   INITIAL IMPRESSION / ASSESSMENT AND PLAN / ED COURSE  Pertinent labs & imaging results that were available during my care of  the patient were reviewed by me and considered in my medical decision making (see chart for details).  Review of the [place] CSRS was performed in accordance of the [employer] prior to dispensing any controlled drugs.     Assessment and plan Sinusitis Patient presents to the emergency department with maxillary and frontal sinus tenderness as well as purulent rhinorrhea and pharyngitis.  Differential diagnosis included viral URI, seasonal allergies and strep pharyngitis.  History and physical exam findings are consistent with sinusitis at this time.  Patient was discharged with Augmentin and advised to follow-up with primary care as needed.     ____________________________________________  FINAL CLINICAL IMPRESSION(S) / ED DIAGNOSES  Final diagnoses:  Acute maxillary sinusitis, recurrence not specified      NEW MEDICATIONS STARTED DURING THIS VISIT:  ED Discharge Orders        Ordered    Dyclonine-Glycerin (CEPACOL SORE THROAT SPRAY) 0.1-33 % LIQD  2 times daily PRN     06/09/17 2002    amoxicillin-clavulanate (  AUGMENTIN) 875-125 MG tablet  2 times daily     06/09/17 2002          This chart was dictated using voice recognition software/Dragon. Despite best efforts to proofread, errors can occur which can change the meaning. Any change was purely unintentional.    Gasper Lloyd 06/09/17 2137    Nita Sickle, MD 06/09/17 [phone]

## 2017-06-09 NOTE — ED Triage Notes (Signed)
Pt in with co sore throat, sinus pressure, and congestion. Hx of sinus infections in the past.

## 2017-07-08 ENCOUNTER — Encounter: Payer: Self-pay | Admitting: Emergency Medicine

## 2017-07-08 ENCOUNTER — Emergency Department
Admission: EM | Admit: 2017-07-08 | Discharge: 2017-07-08 | Disposition: A | Discharge status: Home / Self Care | Payer: Medicaid Other | Attending: Emergency Medicine | Admitting: Emergency Medicine

## 2017-07-08 ENCOUNTER — Other Ambulatory Visit: Payer: Self-pay

## 2017-07-08 DIAGNOSIS — N3000 Acute cystitis without hematuria: Secondary | ICD-10-CM | POA: Insufficient documentation

## 2017-07-08 DIAGNOSIS — J45909 Unspecified asthma, uncomplicated: Secondary | ICD-10-CM | POA: Insufficient documentation

## 2017-07-08 DIAGNOSIS — Z79899 Other long term (current) drug therapy: Secondary | ICD-10-CM | POA: Insufficient documentation

## 2017-07-08 DIAGNOSIS — F17200 Nicotine dependence, unspecified, uncomplicated: Secondary | ICD-10-CM | POA: Insufficient documentation

## 2017-07-08 LAB — URINALYSIS, ROUTINE W REFLEX MICROSCOPIC: SPECIFIC GRAVITY, URINE: 1.007 (ref 1.005–1.030)

## 2017-07-08 MED ORDER — PHENAZOPYRIDINE HCL 200 MG PO TABS
200.0000 mg | ORAL_TABLET | Freq: Once | ORAL | Status: AC
  Administered 2017-07-08: 200 mg via ORAL
  Filled 2017-07-08: qty 1

## 2017-07-08 MED ORDER — KETOROLAC TROMETHAMINE 10 MG PO TABS: 10.0000 mg | ORAL_TABLET | Freq: Four times a day (QID) | ORAL | 0 refills | Status: DC | PRN

## 2017-07-08 MED ORDER — KETOROLAC TROMETHAMINE 30 MG/ML IJ SOLN
30.0000 mg | Freq: Once | INTRAMUSCULAR | Status: AC
  Administered 2017-07-08: 30 mg via INTRAMUSCULAR
  Filled 2017-07-08: qty 1

## 2017-07-08 MED ORDER — SULFAMETHOXAZOLE-TRIMETHOPRIM 800-160 MG PO TABS: 1.0000 | ORAL_TABLET | Freq: Two times a day (BID) | ORAL | 0 refills | Status: DC

## 2017-07-08 MED ORDER — PHENAZOPYRIDINE HCL 200 MG PO TABS: 200.0000 mg | ORAL_TABLET | Freq: Three times a day (TID) | ORAL | 0 refills | Status: DC | PRN

## 2017-07-08 MED ORDER — CEFTRIAXONE SODIUM 1 G IJ SOLR
1.0000 g | Freq: Once | INTRAMUSCULAR | Status: AC
  Administered 2017-07-08: 1 g via INTRAMUSCULAR
  Filled 2017-07-08: qty 10

## 2017-07-08 MED ORDER — LIDOCAINE HCL (PF) 1 % IJ SOLN
2.1000 mL | Freq: Once | INTRAMUSCULAR | Status: AC
  Administered 2017-07-08: 2.1 mL via INTRADERMAL
  Filled 2017-07-08: qty 5

## 2017-07-08 NOTE — ED Triage Notes (Signed)
Pt arrives ambulatory to triage with c/o lower pelvic pain which hurts when she urinates. Pt is in NAD.

## 2017-07-08 NOTE — ED Provider Notes (Signed)
[facility] Emergency Department Provider Note  ____________________________________________  Time seen: Approximately 9:23 PM  I have reviewed the triage vital signs and the nursing notes.   HISTORY  Chief Complaint Pelvic Pain    HPI Kayla Mcgee is a 37 y.o. female who presents to the emergency department for treatment and evaluation of lower pelvic pain that increases with urination.  Patient denies known fever, nausea, vomiting.  She denies frequent urinary tract infections.  Past Medical History:  Diagnosis Date  . Anxiety   . Asthma   . H. pylori infection   . Panic attacks   . Seasonal allergies     There are no active problems to display for this patient.   Past Surgical History:  Procedure Laterality Date  . CESAREAN SECTION    . TONSILLECTOMY      Prior to Admission medications   Medication Sig Start Date End Date Taking? Authorizing Provider  albuterol (PROVENTIL HFA;VENTOLIN HFA) 108 (90 BASE) MCG/ACT inhaler Inhale 2 puffs into the lungs every 6 (six) hours as needed for wheezing or shortness of breath. 02/19/15   Menshew, Charlesetta IvoryJenise V Bacon, PA-C  azithromycin (ZITHROMAX Z-PAK) 250 MG tablet Take 2 tablets (500 mg) on  Day 1,  followed by 1 tablet (250 mg) once daily on Days 2 through 5. 01/06/16   Tommi RumpsSummers, Rhonda L, PA-C  cetirizine (ZYRTEC) 10 MG tablet Take 1 tablet (10 mg total) by mouth daily. 03/02/16   Hagler, Jami L, PA-C  ketorolac (TORADOL) 10 MG tablet Take 1 tablet (10 mg total) by mouth every 6 (six) hours as needed. 07/08/17   Macklen Wilhoite, Kasandra Knudsenari B, FNP  metoCLOPramide (REGLAN) 10 MG tablet Take 0.5 tablets (5 mg total) by mouth every 8 (eight) hours as needed for nausea or vomiting. 09/29/16 10/04/16  Menshew, Charlesetta IvoryJenise V Bacon, PA-C  phenazopyridine (PYRIDIUM) 200 MG tablet Take 1 tablet (200 mg total) by mouth 3 (three) times daily as needed for pain. 07/08/17   Quintavius Niebuhr, Rulon Eisenmengerari B, FNP  predniSONE (DELTASONE) 5 MG tablet Take 6  tablets (30 mg total) by mouth daily with breakfast. May take for up to 5 days.  Do not take any NSAIDs with this medication. Take with food. 03/02/16   Hagler, Jami L, PA-C  ranitidine (ZANTAC) 150 MG tablet Take 1 tablet (150 mg total) by mouth 2 (two) times daily. 01/06/16 01/05/17  Tommi RumpsSummers, Rhonda L, PA-C  sulfamethoxazole-trimethoprim (BACTRIM DS,SEPTRA DS) 800-160 MG tablet Take 1 tablet by mouth 2 (two) times daily. 07/08/17   Chinita Pesterriplett, Svetlana Bagby B, FNP    Allergies Patient has no known allergies.  No family history on file.  Social History Social History   Tobacco Use  . Smoking status: Current Every Day Smoker    Packs/day: 0.50  . Smokeless tobacco: Never Used  Substance Use Topics  . Alcohol use: No  . Drug use: No    Review of Systems Constitutional: Negative for fever. Respiratory: Negative for shortness of breath or cough. Gastrointestinal: Positive for abdominal pain; negative for nausea , negative for vomiting. Genitourinary: Positive for dysuria , negative for vaginal discharge. Positive for urinary frequency. Musculoskeletal: Negative for back pain. Skin: Negative for rash, lesion, or wound. ____________________________________________   PHYSICAL EXAM:  VITAL SIGNS: ED Triage Vitals  Enc Vitals Group     BP 07/08/17 2106 135/85     Pulse Rate 07/08/17 2106 84     Resp 07/08/17 2106 18     Temp 07/08/17 2106 (!) 97.3 F (  36.3 C)     Temp Source 07/08/17 2106 Oral     SpO2 07/08/17 2106 99 %     Weight 07/08/17 2055 208 lb (94.3 kg)     Height 07/08/17 2055 5\' 6"  (1.676 m)     Head Circumference --      Peak Flow --      Pain Score 07/08/17 2054 10     Pain Loc --      Pain Edu? --      Excl. in GC? --     Constitutional: Alert and oriented. Well appearing and in no acute distress. Eyes: Conjunctivae are normal. PERRL. EOMI. Head: Atraumatic. Nose: No congestion/rhinnorhea. Mouth/Throat: Mucous membranes are moist. Respiratory: Normal respiratory  effort.  No retractions. Gastrointestinal: Suprapubic tenderness on exam. Genitourinary: Pelvic exam: not indicated. Musculoskeletal: No extremity tenderness nor edema.  Neurologic:  Normal speech and language. No gross focal neurologic deficits are appreciated. Speech is normal. No gait instability. Skin:  Skin is warm, dry and intact. No rash noted. Psychiatric: Mood and affect are normal. Speech and behavior are normal.  ____________________________________________   LABS (all labs ordered are listed, but only abnormal results are displayed)  Labs Reviewed  URINALYSIS, ROUTINE W REFLEX MICROSCOPIC - Abnormal; Notable for the following components:      Result Value   Color, Urine ORANGE (*)    APPearance HAZY (*)    Glucose, UA   (*)    Value: TEST NOT REPORTED DUE TO COLOR INTERFERENCE OF URINE PIGMENT   Hgb urine dipstick   (*)    Value: TEST NOT REPORTED DUE TO COLOR INTERFERENCE OF URINE PIGMENT   Bilirubin Urine   (*)    Value: TEST NOT REPORTED DUE TO COLOR INTERFERENCE OF URINE PIGMENT   Ketones, ur   (*)    Value: TEST NOT REPORTED DUE TO COLOR INTERFERENCE OF URINE PIGMENT   Protein, ur   (*)    Value: TEST NOT REPORTED DUE TO COLOR INTERFERENCE OF URINE PIGMENT   Nitrite   (*)    Value: TEST NOT REPORTED DUE TO COLOR INTERFERENCE OF URINE PIGMENT   Leukocytes, UA   (*)    Value: TEST NOT REPORTED DUE TO COLOR INTERFERENCE OF URINE PIGMENT   Bacteria, UA FEW (*)    Squamous Epithelial / LPF 0-5 (*)    All other components within normal limits  POC URINE PREG, ED   ____________________________________________  RADIOLOGY  Not indicated. ____________________________________________   PROCEDURES  Procedure(s) performed: None  ____________________________________________  37 year old female presenting to the emergency department for treatment and evaluation of pelvic pain that started this evening. She states pain is intense after urinating and she is having  to go every few minutes. She has had similar pain with previous UTIs. She will be treated with Rocephin per patient's request to have a "antibiotic shot."   ----------------------------------------- 10:48 PM on 07/08/2017 -----------------------------------------  Much improved after medications. She will be discharged home with bactrim, pyridium, and toradol. She is to follow up with her PCP for symptoms that are not improving with medications.  INITIAL IMPRESSION / ASSESSMENT AND PLAN / ED COURSE  Pertinent labs & imaging results that were available during my care of the patient were reviewed by me and considered in my medical decision making (see chart for details).  ____________________________________________   FINAL CLINICAL IMPRESSION(S) / ED DIAGNOSES  Final diagnoses:  Acute cystitis without hematuria    Note:  This document was prepared  using Conservation officer, historic buildings and may include unintentional dictation errors.    Chinita Pester, FNP 07/08/17 2249    Minna Antis, MD 07/08/17 2315

## 2017-08-20 ENCOUNTER — Emergency Department: Payer: Self-pay

## 2017-08-20 ENCOUNTER — Encounter: Payer: Self-pay | Admitting: Emergency Medicine

## 2017-08-20 ENCOUNTER — Emergency Department
Admission: EM | Admit: 2017-08-20 | Discharge: 2017-08-20 | Disposition: A | Discharge status: Home / Self Care | Payer: Self-pay | Attending: Emergency Medicine | Admitting: Emergency Medicine

## 2017-08-20 ENCOUNTER — Other Ambulatory Visit: Payer: Self-pay

## 2017-08-20 DIAGNOSIS — Y939 Activity, unspecified: Secondary | ICD-10-CM | POA: Insufficient documentation

## 2017-08-20 DIAGNOSIS — S301XXA Contusion of abdominal wall, initial encounter: Secondary | ICD-10-CM

## 2017-08-20 DIAGNOSIS — R109 Unspecified abdominal pain: Secondary | ICD-10-CM

## 2017-08-20 DIAGNOSIS — Z79899 Other long term (current) drug therapy: Secondary | ICD-10-CM | POA: Insufficient documentation

## 2017-08-20 DIAGNOSIS — Y999 Unspecified external cause status: Secondary | ICD-10-CM | POA: Insufficient documentation

## 2017-08-20 DIAGNOSIS — F17210 Nicotine dependence, cigarettes, uncomplicated: Secondary | ICD-10-CM | POA: Insufficient documentation

## 2017-08-20 DIAGNOSIS — W541XXA Struck by dog, initial encounter: Secondary | ICD-10-CM | POA: Insufficient documentation

## 2017-08-20 DIAGNOSIS — J45909 Unspecified asthma, uncomplicated: Secondary | ICD-10-CM | POA: Insufficient documentation

## 2017-08-20 DIAGNOSIS — R1030 Lower abdominal pain, unspecified: Secondary | ICD-10-CM | POA: Insufficient documentation

## 2017-08-20 LAB — COMPREHENSIVE METABOLIC PANEL
ALBUMIN: 3.8 g/dL (ref 3.5–5.0)
ALT: 15 U/L (ref 14–54)
ANION GAP: 9 (ref 5–15)
AST: 20 U/L (ref 15–41)
Alkaline Phosphatase: 80 U/L (ref 38–126)
BILIRUBIN TOTAL: 0.3 mg/dL (ref 0.3–1.2)
BUN: 16 mg/dL (ref 6–20)
CO2: 23 mmol/L (ref 22–32)
Calcium: 8.8 mg/dL — ABNORMAL LOW (ref 8.9–10.3)
Chloride: 107 mmol/L (ref 101–111)
Creatinine, Ser: 0.76 mg/dL (ref 0.44–1.00)
GFR calc Af Amer: >60 mL/min (ref >60)
GLUCOSE: 104 mg/dL — AB (ref 65–99)
POTASSIUM: 3.3 mmol/L — AB (ref 3.5–5.1)
Sodium: 139 mmol/L (ref 135–145)
TOTAL PROTEIN: 7.4 g/dL (ref 6.5–8.1)

## 2017-08-20 LAB — URINALYSIS, COMPLETE (UACMP) WITH MICROSCOPIC
BILIRUBIN URINE: NEGATIVE
Bacteria, UA: NONE SEEN
GLUCOSE, UA: NEGATIVE mg/dL
Ketones, ur: NEGATIVE mg/dL
LEUKOCYTES UA: NEGATIVE
NITRITE: NEGATIVE
PH: 6 (ref 5.0–8.0)
Protein, ur: NEGATIVE mg/dL
SPECIFIC GRAVITY, URINE: 1.02 (ref 1.005–1.030)

## 2017-08-20 LAB — CBC
HEMATOCRIT: 30.3 % — AB (ref 35.0–47.0)
Hemoglobin: 9.8 g/dL — ABNORMAL LOW (ref 12.0–16.0)
MCH: 22.5 pg — ABNORMAL LOW (ref 26.0–34.0)
MCHC: 32.3 g/dL (ref 32.0–36.0)
MCV: 69.7 fL — ABNORMAL LOW (ref 80.0–100.0)
PLATELETS: 243 10*3/uL (ref 150–440)
RBC: 4.35 MIL/uL (ref 3.80–5.20)
RDW: 17.9 % — AB (ref 11.5–14.5)
WBC: 9.2 10*3/uL (ref 3.6–11.0)

## 2017-08-20 LAB — LIPASE, BLOOD: LIPASE: 47 U/L (ref 11–51)

## 2017-08-20 LAB — PREGNANCY, URINE: Preg Test, Ur: NEGATIVE

## 2017-08-20 MED ORDER — DIPHENHYDRAMINE HCL 50 MG/ML IJ SOLN: 25.0000 mg | Freq: Once | INTRAMUSCULAR | Status: DC

## 2017-08-20 MED ORDER — PROCHLORPERAZINE EDISYLATE 10 MG/2ML IJ SOLN: 10.0000 mg | Freq: Once | INTRAMUSCULAR | Status: DC

## 2017-08-20 NOTE — ED Provider Notes (Signed)
[facility] Emergency Department Provider Note  Time seen: 10:07 PM  I have reviewed the triage vital signs and the nursing notes.   HISTORY  Chief Complaint Abdominal Pain    HPI Kayla Mcgee is a 37 y.o. female with a past medical history of anxiety, presents to the emergency department for abdominal pain.  According to the patient this morning she was sleeping when her large dog jumped onto her abdomen.  States since this morning she has continued to have pain in the lower abdomen and left side.  States the pain has continued it is constant 4/10 dull aching pain.  Called her doctor who referred her to the emergency department for evaluation.  Patient denies any hematuria or dysuria.  No nausea or vomiting.  No diarrhea.  No fever.  Last mental period was approximately 2 weeks ago.   Past Medical History:  Diagnosis Date  . Anxiety   . Asthma   . H. pylori infection   . Panic attacks   . Seasonal allergies     There are no active problems to display for this patient.   Past Surgical History:  Procedure Laterality Date  . CESAREAN SECTION    . TONSILLECTOMY    . TUBAL LIGATION      Prior to Admission medications   Medication Sig Start Date End Date Taking? Authorizing Provider  esomeprazole (NEXIUM) 20 MG capsule Take 20 mg by mouth daily at 12 noon.   Yes [provider]    No Known Allergies  History reviewed. No pertinent family history.  Social History Social History   Tobacco Use  . Smoking status: Current Every Day Smoker    Packs/day: 0.50    Types: Cigarettes  . Smokeless tobacco: Never Used  Substance Use Topics  . Alcohol use: No  . Drug use: No    Review of Systems Constitutional: Negative for fever. Eyes: Negative for visual complaints ENT: Negative for recent illness/congestion Cardiovascular: Negative for chest pain. Respiratory: Negative for shortness of breath. Gastrointestinal: Moderate lower  abdominal pain.  Negative for nausea vomiting or diarrhea. Genitourinary: Negative for dysuria or hematuria.  Last menstrual.  2 weeks ago. Musculoskeletal: Negative for musculoskeletal complaints Skin: Negative for skin complaints  Neurological: Negative for headache All other ROS negative  ____________________________________________   PHYSICAL EXAM:  VITAL SIGNS: ED Triage Vitals  Enc Vitals Group     BP 08/20/17 2143 126/85     Pulse Rate 08/20/17 2143 75     Resp 08/20/17 2143 18     Temp 08/20/17 2143 98.3 F (36.8 C)     Temp Source 08/20/17 2143 Oral     SpO2 08/20/17 2143 100 %     Weight 08/20/17 2145 206 lb (93.4 kg)     Height 08/20/17 2145 5' 5.5" (1.664 m)     Head Circumference --      Peak Flow --      Pain Score 08/20/17 2144 4     Pain Loc --      Pain Edu? --      Excl. in GC? --    Constitutional: Alert and oriented. Well appearing and in no distress. Eyes: Normal exam ENT   Head: Normocephalic and atraumatic.   Mouth/Throat: Mucous membranes are moist. Cardiovascular: Normal rate, regular rhythm. No murmur Respiratory: Normal respiratory effort without tachypnea nor retractions. Breath sounds are clear Gastrointestinal: Soft, mild suprapubic and lower abdominal tenderness to palpation.  No rebound or guarding.  No distention. Musculoskeletal: Nontender with normal range of motion in all extremities.  Neurologic:  Normal speech and language. No gross focal neurologic deficits Skin:  Skin is warm, dry and intact.  Psychiatric: Mood and affect are normal.   ____________________________________________   RADIOLOGY  CT negative  ____________________________________________   INITIAL IMPRESSION / ASSESSMENT AND PLAN / ED COURSE  Pertinent labs & imaging results that were available during my care of the patient were reviewed by me and considered in my medical decision making (see chart for details).  Patient presents to the emergency  department with lower abdominal pain since being jumped on by her dog this morning.  Patient rates the pain is a 4/10 dull aching pain.  Denies any other symptoms.  Differential would include contusion, intra-abdominal pathology such as urinary tract infection, ovarian cyst.  We will check labs, given the patient's continued pain will obtain CT imaging.  Patient agreeable to plan of care.  ____________________________________________   FINAL CLINICAL IMPRESSION(S) / ED DIAGNOSES  Lower abdominal pain    Minna AntisPaduchowski, Mathayus Stanbery, MD 08/21/17 2305

## 2017-08-20 NOTE — ED Triage Notes (Addendum)
Pt says her dog jumped on her belly this am while she was asleep and it's been hurting all day; c/o pain to lower middle abd; denies urinary or bowel signs or symptoms; pt says when she pushes on her lower abd it feels firm, like she's pregnant; but she is not pregnant;

## 2017-08-20 NOTE — ED Provider Notes (Signed)
-----------------------------------------   11:15 PM on 08/20/2017 -----------------------------------------  ED interpretation: No acute traumatic injuries  CT renal stone study interpreted per Dr. Si GaulHu: 1. No evidence of acute abnormality  2. Nonobstructing RIGHT renal calculus.   Updated patient on CT result.  Discharge per previous provider.  Strict return precautions given.  Patient verbalizes understanding and agrees with plan of care.   Irean HongSung, Geneviene Tesch J, MD 08/21/17 [id number]

## 2017-08-20 NOTE — Discharge Instructions (Signed)
You have been seen in the emergency department for abdominal pain.  Your work-up including CT imaging is largely normal.  Please take Tylenol or ibuprofen as needed for discomfort as written on the box.  Please follow-up with your primary care doctor as needed.

## 2017-08-20 NOTE — ED Notes (Signed)
ED Provider at bedside. 

## 2018-05-12 ENCOUNTER — Encounter: Payer: Self-pay | Admitting: Emergency Medicine

## 2018-05-12 ENCOUNTER — Other Ambulatory Visit: Payer: Self-pay

## 2018-05-12 ENCOUNTER — Emergency Department: Payer: Medicaid Other

## 2018-05-12 ENCOUNTER — Emergency Department
Admission: EM | Admit: 2018-05-12 | Discharge: 2018-05-12 | Disposition: A | Discharge status: Home / Self Care | Payer: Medicaid Other | Attending: Emergency Medicine | Admitting: Emergency Medicine

## 2018-05-12 DIAGNOSIS — F17210 Nicotine dependence, cigarettes, uncomplicated: Secondary | ICD-10-CM | POA: Insufficient documentation

## 2018-05-12 DIAGNOSIS — R6889 Other general symptoms and signs: Secondary | ICD-10-CM | POA: Diagnosis not present

## 2018-05-12 DIAGNOSIS — Z79899 Other long term (current) drug therapy: Secondary | ICD-10-CM | POA: Diagnosis not present

## 2018-05-12 DIAGNOSIS — J45909 Unspecified asthma, uncomplicated: Secondary | ICD-10-CM | POA: Insufficient documentation

## 2018-05-12 DIAGNOSIS — R300 Dysuria: Secondary | ICD-10-CM | POA: Diagnosis not present

## 2018-05-12 LAB — BASIC METABOLIC PANEL
Anion gap: 7 (ref 5–15)
BUN: 10 mg/dL (ref 6–20)
CO2: 23 mmol/L (ref 22–32)
Calcium: 8.8 mg/dL — ABNORMAL LOW (ref 8.9–10.3)
Chloride: 106 mmol/L (ref 98–111)
Creatinine, Ser: 0.56 mg/dL (ref 0.44–1.00)
GFR calc Af Amer: >60 mL/min (ref >60)
GFR calc non Af Amer: >60 mL/min (ref >60)
Glucose, Bld: 148 mg/dL — ABNORMAL HIGH (ref 70–99)
Potassium: 3.6 mmol/L (ref 3.5–5.1)
Sodium: 136 mmol/L (ref 135–145)

## 2018-05-12 LAB — URINALYSIS, COMPLETE (UACMP) WITH MICROSCOPIC: Specific Gravity, Urine: 1.024 (ref 1.005–1.030)

## 2018-05-12 LAB — CBC
HEMATOCRIT: 32.2 % — AB (ref 36.0–46.0)
Hemoglobin: 9.7 g/dL — ABNORMAL LOW (ref 12.0–15.0)
MCH: 21.7 pg — ABNORMAL LOW (ref 26.0–34.0)
MCHC: 30.1 g/dL (ref 30.0–36.0)
MCV: 71.9 fL — ABNORMAL LOW (ref 80.0–100.0)
Platelets: 265 10*3/uL (ref 150–400)
RBC: 4.48 MIL/uL (ref 3.87–5.11)
RDW: 17.9 % — ABNORMAL HIGH (ref 11.5–15.5)
WBC: 7.1 10*3/uL (ref 4.0–10.5)
nRBC: 0 % (ref 0.0–0.2)

## 2018-05-12 LAB — URINALYSIS, MICROSCOPIC (REFLEX)

## 2018-05-12 LAB — POCT PREGNANCY, URINE: Preg Test, Ur: NEGATIVE

## 2018-05-12 MED ORDER — CEPHALEXIN 500 MG PO CAPS: 500.0000 mg | ORAL_CAPSULE | Freq: Three times a day (TID) | ORAL | 0 refills | Status: AC

## 2018-05-12 MED ORDER — ONDANSETRON 4 MG PO TBDP
4.0000 mg | ORAL_TABLET | Freq: Once | ORAL | Status: AC
  Administered 2018-05-12: 4 mg via ORAL
  Filled 2018-05-12: qty 1

## 2018-05-12 MED ORDER — OXYMETAZOLINE HCL 0.05 % NA SOLN: 2.0000 | Freq: Two times a day (BID) | NASAL | 0 refills | Status: AC

## 2018-05-12 MED ORDER — IBUPROFEN 600 MG PO TABS: 600.0000 mg | ORAL_TABLET | Freq: Four times a day (QID) | ORAL | 0 refills | Status: DC | PRN

## 2018-05-12 MED ORDER — IBUPROFEN 800 MG PO TABS
800.0000 mg | ORAL_TABLET | Freq: Once | ORAL | Status: AC
  Administered 2018-05-12: 800 mg via ORAL
  Filled 2018-05-12: qty 1

## 2018-05-12 MED ORDER — OXYMETAZOLINE HCL 0.05 % NA SOLN
1.0000 | Freq: Once | NASAL | Status: AC
  Administered 2018-05-12: 1 via NASAL
  Filled 2018-05-12: qty 30

## 2018-05-12 MED ORDER — CEPHALEXIN 500 MG PO CAPS
500.0000 mg | ORAL_CAPSULE | Freq: Once | ORAL | Status: AC
  Administered 2018-05-12: 500 mg via ORAL
  Filled 2018-05-12: qty 1

## 2018-05-12 MED ORDER — PSEUDOEPHEDRINE HCL 30 MG PO TABS: 30.0000 mg | ORAL_TABLET | Freq: Four times a day (QID) | ORAL | 0 refills | Status: AC | PRN

## 2018-05-12 NOTE — ED Provider Notes (Signed)
[facility] Emergency Department Provider Note  ____________________________________________  Time seen: Approximately 12:29 PM  I have reviewed the triage vital signs and the nursing notes.   HISTORY  Chief Complaint Nasal Congestion; Chills; and Bladder pain   HPI Kayla Mcgee is a 38 y.o. female with a history of anxiety, asthma, and H. pylori infection who presents for evaluation of flulike symptoms and dysuria.  Patient reports that she saw her primary care doctor 4 days ago for a UTI.  Her UA was positive and she was started on Macrobid.  She reports that her dysuria has been persistent.  She is also complaining of mild sharp suprapubic abdominal pain associated with it.  No flank pain.  Patient then reports that over the last 2 days she has had flulike symptoms with fever, body aches, cough, congestion, sinus pressure, nausea, vomiting, and diarrhea.  No known sick contact exposures.  No chest pain or shortness of breath.  She has been taking over-the-counter Sudafed with no significant relief.  Past Medical History:  Diagnosis Date  . Anxiety   . Asthma   . H. pylori infection   . Panic attacks   . Seasonal allergies     Past Surgical History:  Procedure Laterality Date  . CESAREAN SECTION    . TONSILLECTOMY    . TUBAL LIGATION      Prior to Admission medications   Medication Sig Start Date End Date Taking? Authorizing Provider  cephALEXin (KEFLEX) 500 MG capsule Take 1 capsule (500 mg total) by mouth 3 (three) times daily for 7 days. 05/12/18 05/19/18  Nita Sickle, MD  esomeprazole (NEXIUM) 20 MG capsule Take 20 mg by mouth daily at 12 noon.    [provider]  ibuprofen (ADVIL,MOTRIN) 600 MG tablet Take 1 tablet (600 mg total) by mouth every 6 (six) hours as needed. 05/12/18   Nita Sickle, MD  oxymetazoline (AFRIN) 0.05 % nasal spray Place 2 sprays into both nostrils 2 (two) times daily. 05/12/18 05/12/19  Nita Sickle, MD  pseudoephedrine (SUDAFED) 30 MG tablet Take 1 tablet (30 mg total) by mouth every 6 (six) hours as needed for congestion. 05/12/18 05/12/19  Nita Sickle, MD    Allergies Patient has no known allergies.  No family history on file.  Social History Social History   Tobacco Use  . Smoking status: Current Every Day Smoker    Packs/day: 0.50    Types: Cigarettes  . Smokeless tobacco: Never Used  Substance Use Topics  . Alcohol use: No  . Drug use: No    Review of Systems  Constitutional: + fever, chills, body aches Eyes: Negative for visual changes. ENT: Negative for sore throat. + sinus congestion Neck: No neck pain  Cardiovascular: Negative for chest pain. Respiratory: Negative for shortness of breath. + cough Gastrointestinal: + suprapubic abdominal pain, nausea, vomiting and diarrhea. Genitourinary: + dysuria. Musculoskeletal: Negative for back pain. Skin: Negative for rash. Neurological: Negative for headaches, weakness or numbness. Psych: No SI or HI  ____________________________________________   PHYSICAL EXAM:  VITAL SIGNS: ED Triage Vitals  Enc Vitals Group     BP 05/12/18 1049 129/76     Pulse Rate 05/12/18 1049 81     Resp 05/12/18 1049 20     Temp 05/12/18 1049 98.4 F (36.9 C)     Temp Source 05/12/18 1049 Oral     SpO2 05/12/18 1049 96 %     Weight 05/12/18 1021 220 lb (99.8 kg)  Height 05/12/18 1021 5\' 6"  (1.676 m)     Head Circumference --      Peak Flow --      Pain Score 05/12/18 1021 7     Pain Loc --      Pain Edu? --      Excl. in GC? --     Constitutional: Alert and oriented. Well appearing and in no apparent distress. HEENT:      Head: Normocephalic and atraumatic.         Eyes: Conjunctivae are normal. Sclera is non-icteric.       Mouth/Throat: Mucous membranes are moist.       Neck: Supple with no signs of meningismus. Cardiovascular: Regular rate and rhythm. No murmurs, gallops, or rubs. 2+ symmetrical  distal pulses are present in all extremities. No JVD. Respiratory: Normal respiratory effort. Lungs are clear to auscultation bilaterally. No wheezes, crackles, or rhonchi.  Gastrointestinal: Soft, non tender, and non distended with positive bowel sounds. No rebound or guarding. Genitourinary: No CVA tenderness. Musculoskeletal: Nontender with normal range of motion in all extremities. No edema, cyanosis, or erythema of extremities. Neurologic: Normal speech and language. Face is symmetric. Moving all extremities. No gross focal neurologic deficits are appreciated. Skin: Skin is warm, dry and intact. No rash noted. Psychiatric: Mood and affect are normal. Speech and behavior are normal.  ____________________________________________   LABS (all labs ordered are listed, but only abnormal results are displayed)  Labs Reviewed  URINALYSIS, COMPLETE (UACMP) WITH MICROSCOPIC - Abnormal; Notable for the following components:      Result Value   Color, Urine RED (*)    APPearance TURBID (*)    Glucose, UA   (*)    Value: TEST NOT REPORTED DUE TO COLOR INTERFERENCE OF URINE PIGMENT   Hgb urine dipstick   (*)    Value: TEST NOT REPORTED DUE TO COLOR INTERFERENCE OF URINE PIGMENT   Bilirubin Urine   (*)    Value: TEST NOT REPORTED DUE TO COLOR INTERFERENCE OF URINE PIGMENT   Ketones, ur   (*)    Value: TEST NOT REPORTED DUE TO COLOR INTERFERENCE OF URINE PIGMENT   Protein, ur   (*)    Value: TEST NOT REPORTED DUE TO COLOR INTERFERENCE OF URINE PIGMENT   Nitrite   (*)    Value: TEST NOT REPORTED DUE TO COLOR INTERFERENCE OF URINE PIGMENT   Leukocytes,Ua   (*)    Value: TEST NOT REPORTED DUE TO COLOR INTERFERENCE OF URINE PIGMENT   All other components within normal limits  BASIC METABOLIC PANEL - Abnormal; Notable for the following components:   Glucose, Bld 148 (*)    Calcium 8.8 (*)    All other components within normal limits  CBC - Abnormal; Notable for the following components:    Hemoglobin 9.7 (*)    HCT 32.2 (*)    MCV 71.9 (*)    MCH 21.7 (*)    RDW 17.9 (*)    All other components within normal limits  URINALYSIS, MICROSCOPIC (REFLEX) - Abnormal; Notable for the following components:   Bacteria, UA RARE (*)    All other components within normal limits  URINE CULTURE  POC URINE PREG, ED  POCT PREGNANCY, URINE   ____________________________________________  EKG  none  ____________________________________________  RADIOLOGY  I have personally reviewed the images performed during this visit and I agree with the Radiologist's read.   Interpretation by Radiologist:  Dg Chest 2 View  Result Date: 05/12/2018  CLINICAL DATA:  Cough, fever. EXAM: CHEST - 2 VIEW COMPARISON:  Radiographs of January 06, 2016. FINDINGS: The heart size and mediastinal contours are within normal limits. Both lungs are clear. Old bilateral rib fractures are noted. IMPRESSION: No active cardiopulmonary disease. Electronically Signed   By: Lupita Raider, M.D.   On: 05/12/2018 11:55     ____________________________________________   PROCEDURES  Procedure(s) performed: None Procedures Critical Care performed:  None ____________________________________________   INITIAL IMPRESSION / ASSESSMENT AND PLAN / ED COURSE  38 y.o. female with a history of anxiety, asthma, and H. pylori infection who presents for evaluation of flulike symptoms and dysuria.   # flu like symptoms: Patient with fever, chills, cough, congestion, nausea, vomiting and diarrhea.  She is well-appearing, looks extremely well hydrated, normal vital signs.  Labs showing no leukocytosis, no significant signs of dehydration. CXR negative for PNA. No asthma exacerbation. Patient also complaining of sinus pressure.  Will treat with supportive care, TheraFlu, Afrin.  # dysuria: Patient reports history of recurrent UTIs.  Has been on Macrobid for 4 days with no relief of her urinary symptoms.  Unfortunately her  urinalysis is unable to be evaluated since patient has been on Azo.  I do not have a culture from her urinalysis done at the outside clinic.  We will send a culture at this time and will switch patient to Keflex.  No signs of pyelonephritis or sepsis.      As part of my medical decision making, I reviewed the following data within the electronic MEDICAL RECORD NUMBER Nursing notes reviewed and incorporated, Labs reviewed , Old chart reviewed, Radiograph reviewed , Notes from prior ED visits and [place] Controlled Substance Database    Pertinent labs & imaging results that were available during my care of the patient were reviewed by me and considered in my medical decision making (see chart for details).    ____________________________________________   FINAL CLINICAL IMPRESSION(S) / ED DIAGNOSES  Final diagnoses:  Flu-like symptoms  Dysuria      NEW MEDICATIONS STARTED DURING THIS VISIT:  ED Discharge Orders         Ordered    cephALEXin (KEFLEX) 500 MG capsule  3 times daily     05/12/18 1236    oxymetazoline (AFRIN) 0.05 % nasal spray  2 times daily     05/12/18 1236    ibuprofen (ADVIL,MOTRIN) 600 MG tablet  Every 6 hours PRN     05/12/18 1236    pseudoephedrine (SUDAFED) 30 MG tablet  Every 6 hours PRN     05/12/18 1236           Note:  This document was prepared using Dragon voice recognition software and may include unintentional dictation errors.    Don Perking, [place], MD 05/12/18 1236

## 2018-05-12 NOTE — ED Triage Notes (Signed)
Pt here with c/o nasal congestion and chills since yesterday, being treated currently for bladder infection and sinus infection. Still having bladder pain, antibiotic since Monday. NAD.

## 2018-05-14 LAB — URINE CULTURE: Culture: NO GROWTH

## 2018-05-16 ENCOUNTER — Emergency Department
Admission: EM | Admit: 2018-05-16 | Discharge: 2018-05-16 | Disposition: A | Discharge status: Home / Self Care | Payer: Medicaid Other | Attending: Emergency Medicine | Admitting: Emergency Medicine

## 2018-05-16 ENCOUNTER — Encounter: Payer: Self-pay | Admitting: *Deleted

## 2018-05-16 ENCOUNTER — Other Ambulatory Visit: Payer: Self-pay

## 2018-05-16 ENCOUNTER — Emergency Department: Payer: Medicaid Other

## 2018-05-16 DIAGNOSIS — F17210 Nicotine dependence, cigarettes, uncomplicated: Secondary | ICD-10-CM | POA: Insufficient documentation

## 2018-05-16 DIAGNOSIS — R301 Vesical tenesmus: Secondary | ICD-10-CM | POA: Diagnosis not present

## 2018-05-16 DIAGNOSIS — J45909 Unspecified asthma, uncomplicated: Secondary | ICD-10-CM | POA: Insufficient documentation

## 2018-05-16 DIAGNOSIS — R102 Pelvic and perineal pain: Secondary | ICD-10-CM | POA: Diagnosis present

## 2018-05-16 DIAGNOSIS — N3289 Other specified disorders of bladder: Secondary | ICD-10-CM

## 2018-05-16 LAB — COMPREHENSIVE METABOLIC PANEL
ALT: 18 U/L (ref 0–44)
AST: 18 U/L (ref 15–41)
Albumin: 3.7 g/dL (ref 3.5–5.0)
Alkaline Phosphatase: 73 U/L (ref 38–126)
Anion gap: 6 (ref 5–15)
BUN: 11 mg/dL (ref 6–20)
CO2: 23 mmol/L (ref 22–32)
Calcium: 8.4 mg/dL — ABNORMAL LOW (ref 8.9–10.3)
Chloride: 109 mmol/L (ref 98–111)
Creatinine, Ser: 0.61 mg/dL (ref 0.44–1.00)
GFR calc Af Amer: >60 mL/min (ref >60)
GFR calc non Af Amer: >60 mL/min (ref >60)
Glucose, Bld: 104 mg/dL — ABNORMAL HIGH (ref 70–99)
Potassium: 3.7 mmol/L (ref 3.5–5.1)
Sodium: 138 mmol/L (ref 135–145)
Total Bilirubin: 0.5 mg/dL (ref 0.3–1.2)
Total Protein: 7.2 g/dL (ref 6.5–8.1)

## 2018-05-16 LAB — CBC
HCT: 30.3 % — ABNORMAL LOW (ref 36.0–46.0)
Hemoglobin: 9.1 g/dL — ABNORMAL LOW (ref 12.0–15.0)
MCH: 21.7 pg — ABNORMAL LOW (ref 26.0–34.0)
MCHC: 30 g/dL (ref 30.0–36.0)
MCV: 72.1 fL — ABNORMAL LOW (ref 80.0–100.0)
PLATELETS: 257 10*3/uL (ref 150–400)
RBC: 4.2 MIL/uL (ref 3.87–5.11)
RDW: 18.1 % — AB (ref 11.5–15.5)
WBC: 8.3 10*3/uL (ref 4.0–10.5)
nRBC: 0 % (ref 0.0–0.2)

## 2018-05-16 LAB — LIPASE, BLOOD: Lipase: 40 U/L (ref 11–51)

## 2018-05-16 MED ORDER — OXYCODONE-ACETAMINOPHEN 5-325 MG PO TABS
1.0000 | ORAL_TABLET | Freq: Once | ORAL | Status: AC
  Administered 2018-05-16: 1 via ORAL
  Filled 2018-05-16: qty 1

## 2018-05-16 MED ORDER — OXYBUTYNIN CHLORIDE 5 MG PO TABS: 5.0000 mg | ORAL_TABLET | Freq: Three times a day (TID) | ORAL | 0 refills | Status: DC

## 2018-05-16 MED ORDER — OXYCODONE-ACETAMINOPHEN 5-325 MG PO TABS
1.0000 | ORAL_TABLET | ORAL | Status: AC
  Administered 2018-05-16: 1 via ORAL
  Filled 2018-05-16: qty 1

## 2018-05-16 MED ORDER — OXYCODONE-ACETAMINOPHEN 5-325 MG PO TABS: 1.0000 | ORAL_TABLET | Freq: Four times a day (QID) | ORAL | 0 refills | Status: DC | PRN

## 2018-05-16 NOTE — ED Notes (Signed)
Pt reporting her urine is orange from medication and she [facility] Pt does not want a urinalysis.

## 2018-05-16 NOTE — ED Triage Notes (Signed)
Pt to ED reporting pelvic and "bladder" pain x 3 weeks that is worsening. Pt has been seen to rule out UTIs and STDs. All testing has returned negative. Pt reporting no CTs of Ultrasounds have been done and she is concerned. No vaginal discharge and no changes in urine. Pt appears uncomfortable in triage.   Pt also reports her urine culture returned "normal"

## 2018-05-16 NOTE — Discharge Instructions (Addendum)
Please continue your antibiotic.  Please be very careful as we discussed to not confuse your oxycodone-containing medication with that containing oxybutynin.  No driving this evening as you took oxycodone in the ER.

## 2018-05-16 NOTE — ED Notes (Signed)
AAOx3.  Skin warm and dry.  NAD 

## 2018-05-16 NOTE — ED Provider Notes (Signed)
[facility] Emergency Department Provider Note  ____________________________________________   First MD Initiated Contact with Patient 05/16/18 1451     (approximate)  I have reviewed the triage vital signs and the nursing notes.   HISTORY  Chief Complaint Pelvic Pain and Dysuria    HPI Kayla Mcgee is a 38 y.o. female here for evaluation of lower abdominal discomfort  Patient reports for 3 weeks now she has been experiencing pain in her lower bladder area.  Discomfort with urination.  Onset unremitting lower abdominal discomfort, but when she urinates the pain increases notably.  She saw the health department yesterday and had an STD evaluation including pelvic examination that was normal, negative.  She reports she was seen in the ER and had a urine culture recently that was negative, she has been treated with 2 antibiotics and continues to have a persistent lower abdominal discomfort  when she reports has had problems in the past with and it feels somewhat like a bladder infection  She reports she has been under a lot of stress she has a 36 year old child with cancer and also a child with autism at home.  She reports that she has not had any nausea or vomiting.  No fevers or chills.  No chest pain or shortness of breath.  Reports is not really abdominal pain is just pain across the lower "bladder" and is been unremitting for 3 weeks  Past Medical History:  Diagnosis Date  . Anxiety   . Asthma   . H. pylori infection   . Panic attacks   . Seasonal allergies     There are no active problems to display for this patient.   Past Surgical History:  Procedure Laterality Date  . CESAREAN SECTION    . TONSILLECTOMY    . TUBAL LIGATION      Prior to Admission medications   Medication Sig Start Date End Date Taking? Authorizing Provider  cephALEXin (KEFLEX) 500 MG capsule Take 1 capsule (500 mg total) by mouth 3 (three) times daily for 7 days.  05/12/18 05/19/18  Nita Sickle, MD  esomeprazole (NEXIUM) 20 MG capsule Take 20 mg by mouth daily at 12 noon.    [provider]  ibuprofen (ADVIL,MOTRIN) 600 MG tablet Take 1 tablet (600 mg total) by mouth every 6 (six) hours as needed. 05/12/18   Nita Sickle, MD  oxybutynin (DITROPAN) 5 MG tablet Take 1 tablet (5 mg total) by mouth 3 (three) times daily. 05/16/18   Sharyn Creamer, MD  oxyCODONE-acetaminophen (PERCOCET/ROXICET) 5-325 MG tablet Take 1 tablet by mouth every 6 (six) hours as needed for severe pain. 05/16/18   Sharyn Creamer, MD  oxymetazoline (AFRIN) 0.05 % nasal spray Place 2 sprays into both nostrils 2 (two) times daily. 05/12/18 05/12/19  Nita Sickle, MD  pseudoephedrine (SUDAFED) 30 MG tablet Take 1 tablet (30 mg total) by mouth every 6 (six) hours as needed for congestion. 05/12/18 05/12/19  Nita Sickle, MD    Allergies Patient has no known allergies.  History reviewed. No pertinent family history.  Social History Social History   Tobacco Use  . Smoking status: Current Every Day Smoker    Packs/day: 0.50    Types: Cigarettes  . Smokeless tobacco: Never Used  Substance Use Topics  . Alcohol use: No  . Drug use: No    Review of Systems Constitutional: No fever/chills Eyes: No visual changes. ENT: No sore throat. Cardiovascular: Denies chest pain. Respiratory: Denies shortness of breath. Gastrointestinal:  No abdominal pain except in her "bladder".   Genitourinary: Negative for dysuria. Musculoskeletal: Negative for back pain. Skin: Negative for rash. Neurological: Negative for headaches, areas of focal weakness or numbness.    ____________________________________________   PHYSICAL EXAM:  VITAL SIGNS: ED Triage Vitals  Enc Vitals Group     BP 05/16/18 1251 (!) 114/59     Pulse Rate 05/16/18 1251 76     Resp 05/16/18 1251 16     Temp 05/16/18 1251 99.1 F (37.3 C)     Temp Source 05/16/18 1251 Oral     SpO2 05/16/18 1251 95  %     Weight 05/16/18 1252 220 lb (99.8 kg)     Height 05/16/18 1252 5\' 6"  (1.676 m)     Head Circumference --      Peak Flow --      Pain Score 05/16/18 1251 8     Pain Loc --      Pain Edu? --      Excl. in GC? --     Constitutional: Alert and oriented. Well appearing and in no acute distress. Eyes: Conjunctivae are normal. Head: Atraumatic. Nose: No congestion/rhinnorhea. Mouth/Throat: Mucous membranes are moist. Neck: No stridor.  Cardiovascular: Normal rate, regular rhythm. Grossly normal heart sounds.  Good peripheral circulation. Respiratory: Normal respiratory effort.  No retractions. Lungs CTAB. Gastrointestinal: Soft and nontender except for moderate discomfort midline over the bladder. No distention.  Reports not pregnant.  Normal menses.  Pelvic pain or discharge. Musculoskeletal: No lower extremity tenderness nor edema. Neurologic:  Normal speech and language. No gross focal neurologic deficits are appreciated.  Skin:  Skin is warm, dry and intact. No rash noted. Psychiatric: Mood and affect are normal. Speech and behavior are normal.  ____________________________________________   LABS (all labs ordered are listed, but only abnormal results are displayed)  Labs Reviewed  COMPREHENSIVE METABOLIC PANEL - Abnormal; Notable for the following components:      Result Value   Glucose, Bld 104 (*)    Calcium 8.4 (*)    All other components within normal limits  CBC - Abnormal; Notable for the following components:   Hemoglobin 9.1 (*)    HCT 30.3 (*)    MCV 72.1 (*)    MCH 21.7 (*)    RDW 18.1 (*)    All other components within normal limits  URINE CULTURE  LIPASE, BLOOD   ____________________________________________  EKG   ____________________________________________  RADIOLOGY  Ct Renal Stone Study  Result Date: 05/16/2018 CLINICAL DATA:  39 year old presenting with three-week history of pelvic pain. Surgical history includes cesarean section x4 and  BILATERAL tubal ligation. Personal history of urinary tract calculi. EXAM: CT ABDOMEN AND PELVIS WITHOUT CONTRAST TECHNIQUE: Multidetector CT imaging of the abdomen and pelvis was performed following the standard protocol without IV contrast. COMPARISON:  08/20/2017. FINDINGS: Lower chest: Heart size normal.  Visualized lung bases clear. Hepatobiliary: Normal unenhanced appearance of the liver. Prominent Riedel's lobe as noted previously. Contracted gallbladder which is otherwise normal in appearance. No biliary ductal dilation. Pancreas: Normal unenhanced appearance. Spleen: Normal unenhanced appearance. Adrenals/Urinary Tract: Normal appearing adrenal glands. Very small (1-2 mm) non-obstructing calculi in mid calices of both kidneys and in a LOWER pole calyx of the RIGHT kidney, best seen on the coronal images. No evidence of an obstructing ureteral calculus on either side. Normal unenhanced appearance of the kidneys otherwise. Urinary bladder decompressed and unremarkable. Stomach/Bowel: Normal appearing stomach filled with food, accounting for the contracted gallbladder. Normal-appearing  small bowel. Opaque ingested material throughout the colon. Moderate colonic stool burden diffusely. No evidence of diverticulosis or other focal abnormality involving the colon. Appendix not clearly identified, but no evidence of pericecal inflammation. Vascular/Lymphatic: No visible atherosclerosis. No pathologic lymphadenopathy. Reproductive: Normal unenhanced appearance of the uterus and ovaries without evidence of adnexal mass. Other: Numerous pelvic phleboliths. Musculoskeletal: Regional skeleton unremarkable without acute or significant osseous abnormality. IMPRESSION: 1. No acute abnormalities involving the abdomen or pelvis. 2. Very small (1-2 mm) non-obstructing calculi in mid calices of both kidneys and in a lower pole calyx of the RIGHT kidney. Electronically Signed   By: Hulan Saashomas  Lawrence M.D.   On: 05/16/2018 16:03      CT scan results reviewed.  Negative for acute. ____________________________________________   PROCEDURES  Procedure(s) performed: None  Procedures  Critical Care performed: No  ____________________________________________   INITIAL IMPRESSION / ASSESSMENT AND PLAN / ED COURSE  Pertinent labs & imaging results that were available during my care of the patient were reviewed by me and considered in my medical decision making (see chart for details).   Lower abdominal discomfort and dysuria.  This been ongoing for several weeks.  Currently on Azo, had a negative culture and has been treated with 2 antibiotics now.  She does not have any fever or leukocytosis.  Etiology somewhat unclear we will proceed with renal CT to evaluate for stone disease or other acute pathology though she denies any acute gastrointestinal symptoms such as vomiting, diarrhea, fever back pain etc.  Very reassuring clinical examination of the abdomen with the exception of discomfort over the suprapubic region.  No adnexal type pains or lower pelvic unilateral pain.  She had a pelvic exam done yesterday and would like to defer reevaluation of that today.  Clinical Course as of May 16 1732  Tue May 16, 2018  1445 Reviewed recent work-up, had urine culture that was negative as well as negative pregnancy test drawn 4 days ago.   [MQ]  1619 Dr. Lonna CobbStoioff recommends oxybutinin 5mg  PO TID. Could be intersititial cystitis but hard to prove in ED. Seems reasonable to continue current keflex through completion. Follow-up with urology.    [MQ]    Clinical Course User Index [MQ] Sharyn CreamerQuale, Mark, MD   ----------------------------------------- 4:16 PM on 05/16/2018 -----------------------------------------  Page Dr. Lonna CobbStoioff, discussed case with him and he advises trial of oxybutynin 3 times daily.  Return precautions and treatment recommendations and follow-up discussed with the patient who is agreeable with the plan.  I  will prescribe the patient a narcotic pain medicine due to their condition which I anticipate will cause at least moderate pain short term. I discussed with the patient safe use of narcotic pain medicines, and that they are not to drive, work in dangerous areas, or ever take more than prescribed (no more than 1 pill every 6 hours). We discussed that this is the type of medication that can be  overdosed on and the risks of this type of medicine. Patient is very agreeable to only use as prescribed and to never use more than prescribed.   ____________________________________________   FINAL CLINICAL IMPRESSION(S) / ED DIAGNOSES  Final diagnoses:  Bladder spasm        Note:  This document was prepared using Dragon voice recognition software and may include unintentional dictation errors       Sharyn CreamerQuale, Mark, MD 05/16/18 1734

## 2018-05-18 LAB — URINE CULTURE
Culture: 20000 — AB
Special Requests: NORMAL

## 2018-08-21 ENCOUNTER — Ambulatory Visit: Payer: Self-pay | Admitting: Urology

## 2018-10-02 ENCOUNTER — Ambulatory Visit: Payer: Self-pay | Admitting: Urology

## 2019-04-30 ENCOUNTER — Ambulatory Visit: Payer: Medicaid Other | Attending: Internal Medicine

## 2019-04-30 DIAGNOSIS — Z20822 Contact with and (suspected) exposure to covid-19: Secondary | ICD-10-CM

## 2019-05-01 LAB — NOVEL CORONAVIRUS, NAA: SARS-CoV-2, NAA: NOT DETECTED

## 2019-06-03 IMAGING — CT CT RENAL STONE PROTOCOL
2 of 4 series · 17 of 46 positions shown, 19 images · non-contrast
Comparison: None.

CLINICAL DATA: 36-year-old female with acute abdominal pain
following abdominal injury today.

EXAM:
CT ABDOMEN AND PELVIS WITHOUT CONTRAST
TECHNIQUE: Multidetector CT imaging of the abdomen and pelvis was performed
following the standard protocol without IV contrast.

[Series 2: stone full standard · axial · 0.79mm/px · z∈[-477,-42]mm · 14 of 95 slices shown, 16 images]
[im 4/95  soft-tissue]
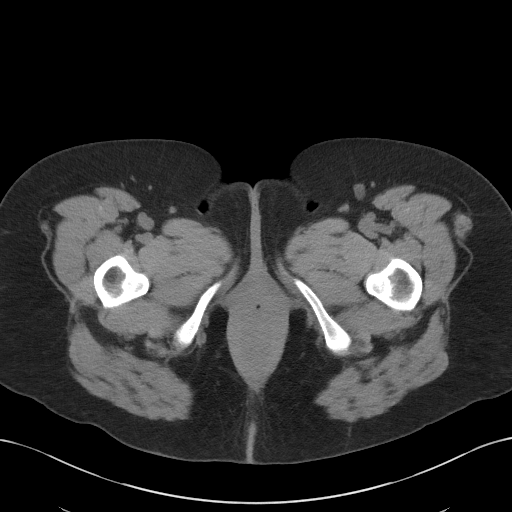
[im 4/95  bone]
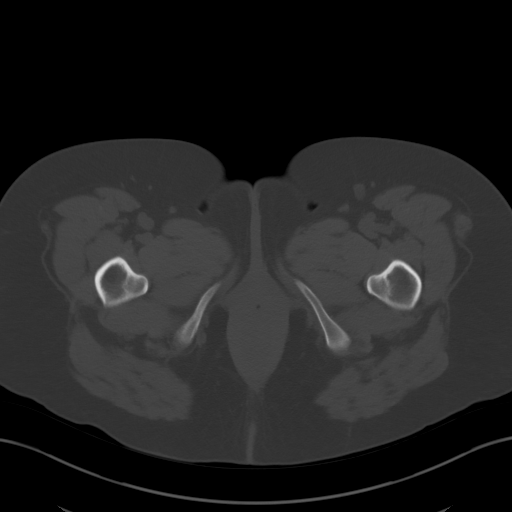
[im 12/95  soft-tissue]
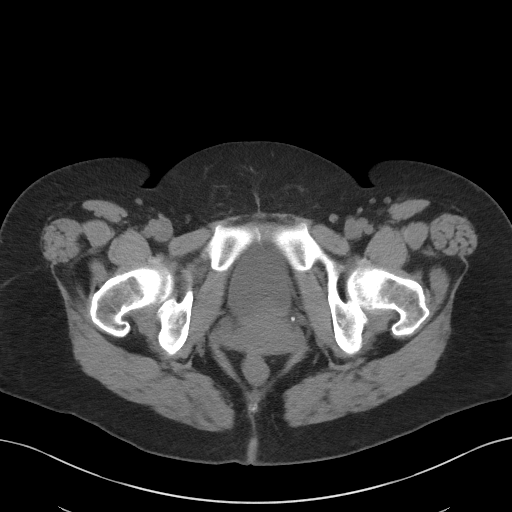
[im 19/95  soft-tissue]
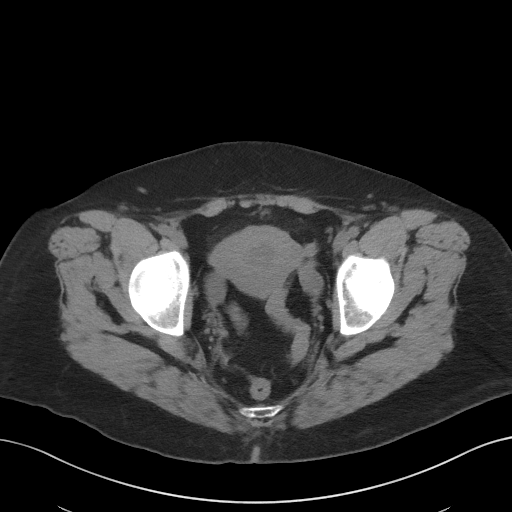
[im 27/95  soft-tissue]
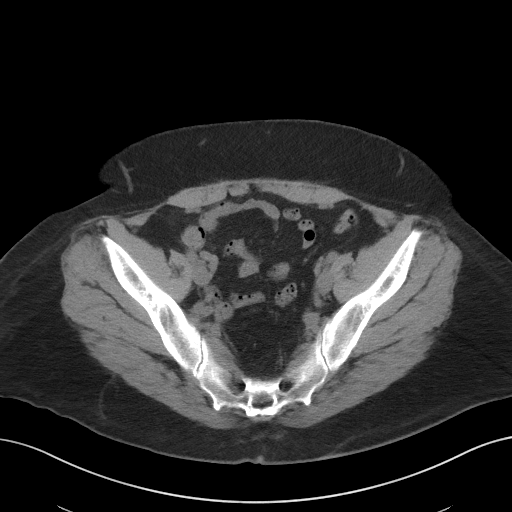
[im 31/95  soft-tissue]
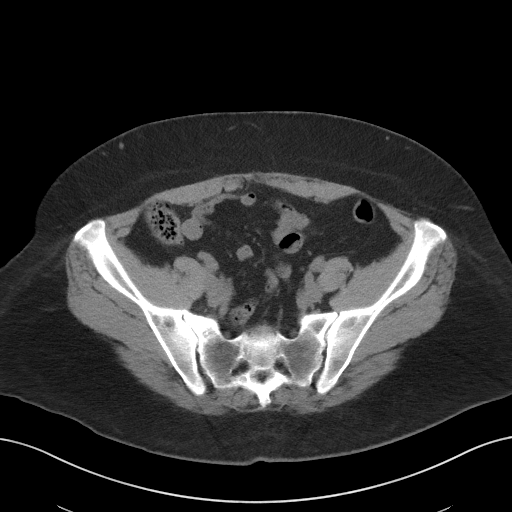
[im 38/95  soft-tissue]
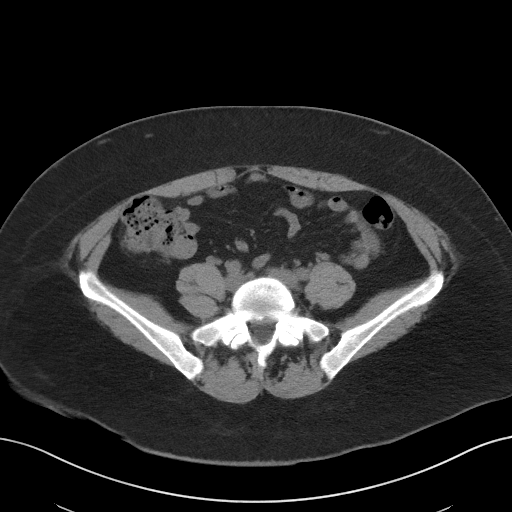
[im 46/95  soft-tissue]
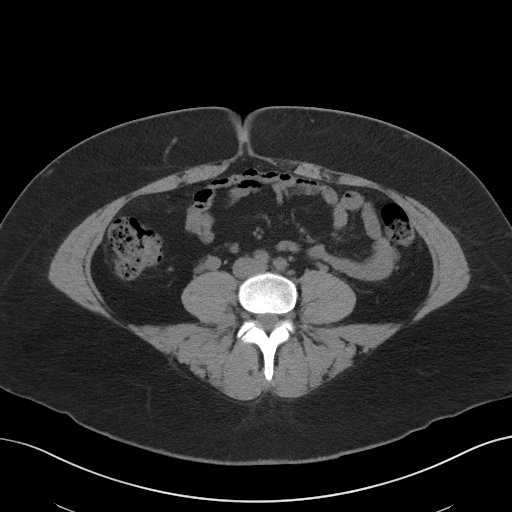
[im 49/95  soft-tissue]
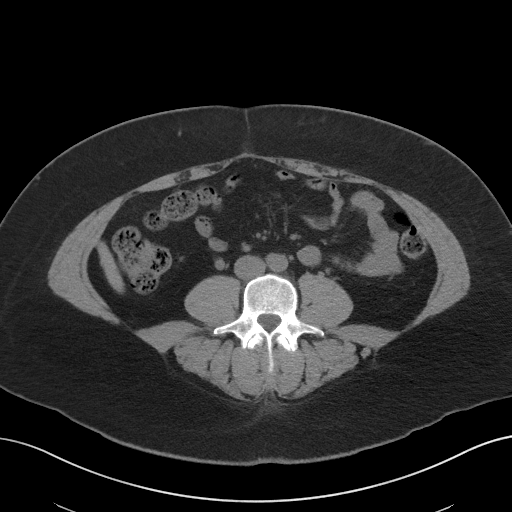
[im 57/95  soft-tissue]
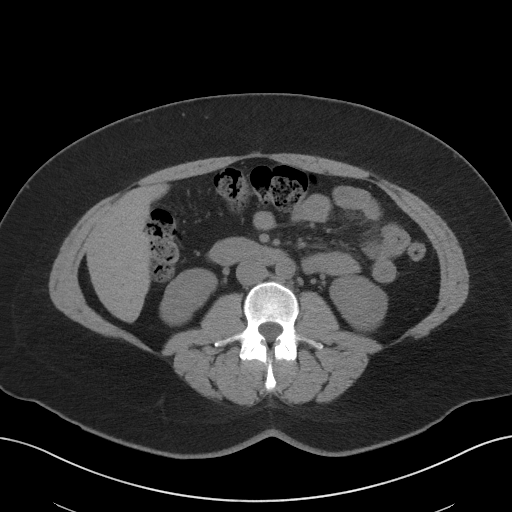
[im 57/95  bone]
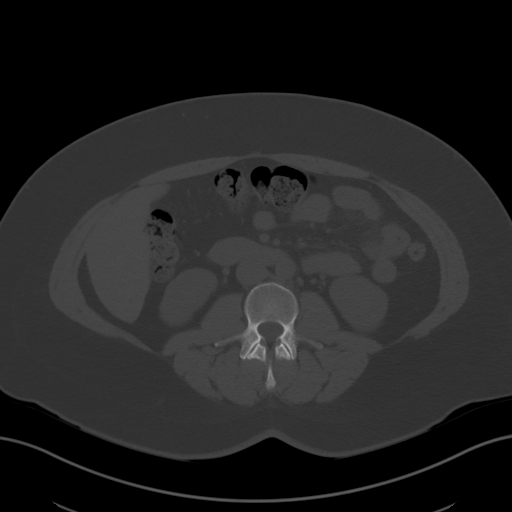
[im 64/95  soft-tissue]
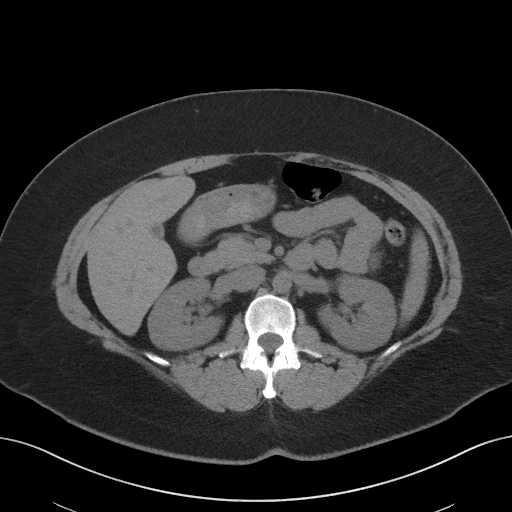
[im 72/95  soft-tissue]
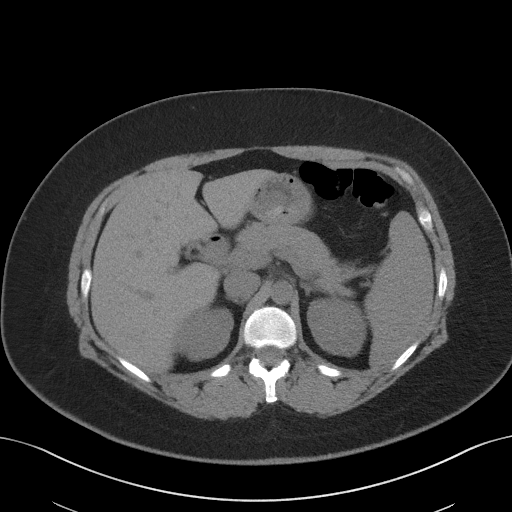
[im 76/95  soft-tissue]
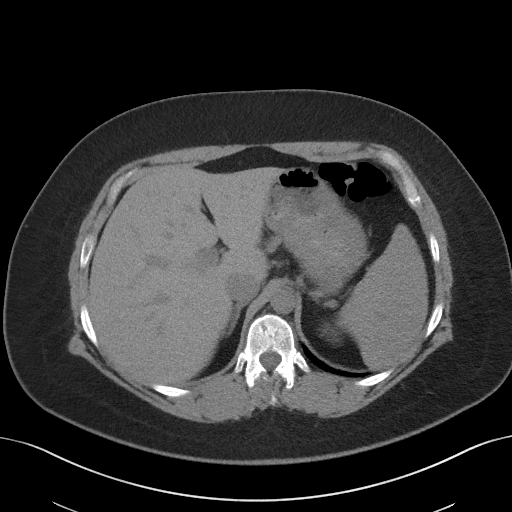
[im 83/95  soft-tissue]
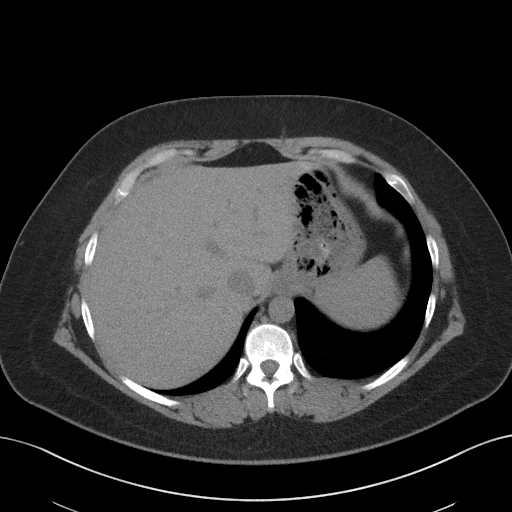
[im 91/95  soft-tissue]
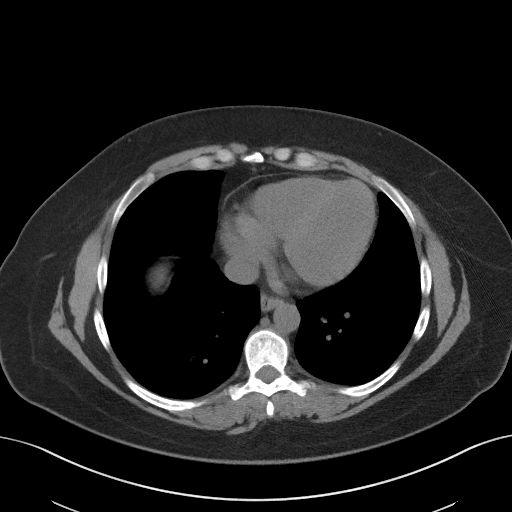

[Series 5: coronal · coronal · 0.82mm/px · 3 of 150 slices shown]
[im 50/150  soft-tissue]
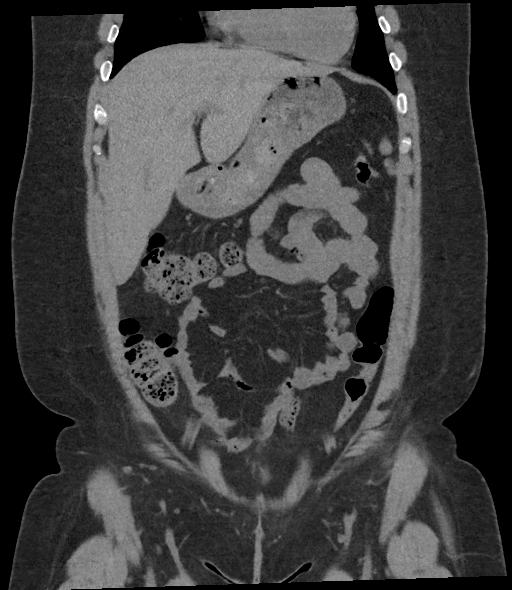
[im 67/150  soft-tissue]
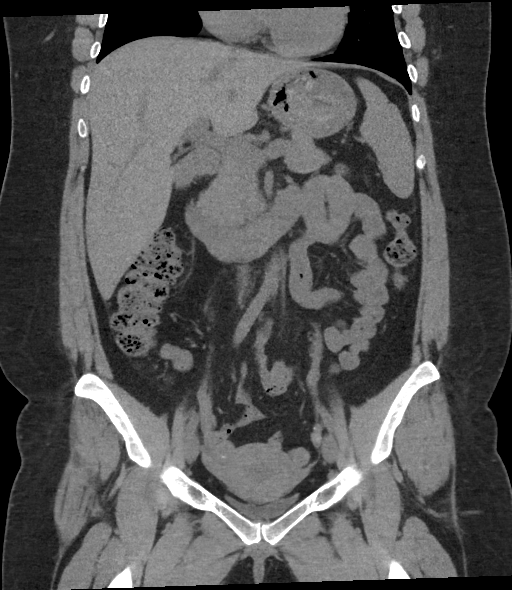
[im 83/150  soft-tissue]
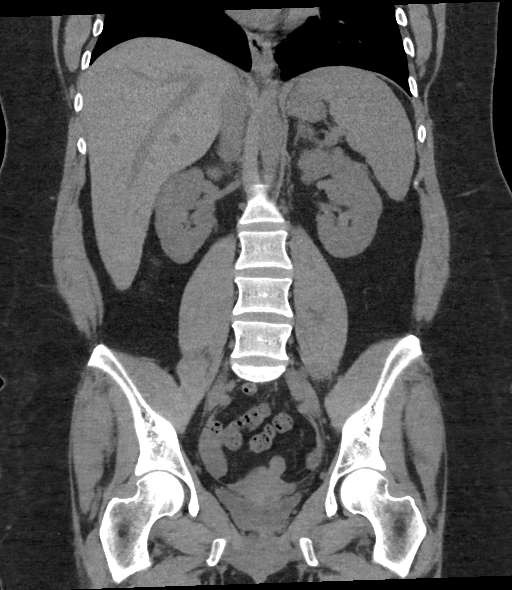

[17 of 46 positions shown; findings below may reference images not displayed]

FINDINGS: Please note that parenchymal abnormalities may be missed without
intravenous contrast.

Lower chest: No acute abnormality.

Hepatobiliary: The liver and gallbladder are unremarkable. No
biliary dilatation.

Pancreas: Unremarkable

Spleen: Unremarkable

Adrenals/Urinary Tract: The kidneys, adrenal glands and bladder are
unremarkable except for a 2 x 9 mm nonobstructing mid RIGHT renal
calculus.

Stomach/Bowel: Stomach is within normal limits. Appendix appears
normal. No evidence of bowel wall thickening, distention, or
inflammatory changes.

Vascular/Lymphatic: No significant vascular findings are present. No
enlarged abdominal or pelvic lymph nodes.

Reproductive: Uterus and bilateral adnexa are unremarkable.

Other: No ascites, pneumoperitoneum or focal collection. No
abdominal wall hernia identified.

Musculoskeletal: No acute or significant osseous findings.
IMPRESSION: 1. No evidence of acute abnormality
2. Nonobstructing RIGHT renal calculus.

## 2019-09-28 ENCOUNTER — Other Ambulatory Visit: Payer: Self-pay | Admitting: Family Medicine

## 2019-09-28 DIAGNOSIS — N921 Excessive and frequent menstruation with irregular cycle: Secondary | ICD-10-CM

## 2019-10-18 ENCOUNTER — Ambulatory Visit: Payer: Medicaid Other

## 2019-11-27 ENCOUNTER — Emergency Department: Payer: Medicaid Other

## 2019-11-27 ENCOUNTER — Other Ambulatory Visit: Payer: Self-pay

## 2019-11-27 ENCOUNTER — Emergency Department
Admission: EM | Admit: 2019-11-27 | Discharge: 2019-11-27 | Discharge status: Against Medical Advice | Payer: Medicaid Other | Attending: Emergency Medicine | Admitting: Emergency Medicine

## 2019-11-27 ENCOUNTER — Encounter: Payer: Self-pay | Admitting: Emergency Medicine

## 2019-11-27 DIAGNOSIS — M79602 Pain in left arm: Secondary | ICD-10-CM

## 2019-11-27 DIAGNOSIS — M542 Cervicalgia: Secondary | ICD-10-CM | POA: Diagnosis not present

## 2019-11-27 DIAGNOSIS — F17210 Nicotine dependence, cigarettes, uncomplicated: Secondary | ICD-10-CM | POA: Diagnosis not present

## 2019-11-27 DIAGNOSIS — M79622 Pain in left upper arm: Secondary | ICD-10-CM | POA: Diagnosis not present

## 2019-11-27 DIAGNOSIS — J45909 Unspecified asthma, uncomplicated: Secondary | ICD-10-CM | POA: Insufficient documentation

## 2019-11-27 MED ORDER — ACETAMINOPHEN 500 MG PO TABS: 1000.0000 mg | ORAL_TABLET | Freq: Once | ORAL | Status: DC

## 2019-11-27 NOTE — ED Notes (Signed)
Pt states she needed to leave b/c her daughter was being discharged in [place]

## 2019-11-27 NOTE — ED Provider Notes (Signed)
[facility] Emergency Department Provider Note  ____________________________________________  Time seen: Approximately 9:38 AM  I have reviewed the triage vital signs and the nursing notes.   HISTORY  Chief Complaint Shoulder Pain and Neck Pain    HPI Kayla Mcgee is a 39 y.o. female that presents to the emergency department for evaluation of left neck and left upper arm pain for 3 weeks.  Pain is worse when she moves her left shoulder.  She has some numbness and tingling to her left hand, mostly her left thumb.  Patient states that it feels like she has a pinched nerve.  No trauma. She has taken aleve and tylenol without relief.  No fever, headache, shortness of breath, chest pain, vomiting, abdominal pain.   Past Medical History:  Diagnosis Date  . Anxiety   . Asthma   . H. pylori infection   . Panic attacks   . Seasonal allergies     There are no problems to display for this patient.   Past Surgical History:  Procedure Laterality Date  . CESAREAN SECTION    . TONSILLECTOMY    . TUBAL LIGATION      Prior to Admission medications   Medication Sig Start Date End Date Taking? Authorizing Provider  esomeprazole (NEXIUM) 20 MG capsule Take 20 mg by mouth daily at 12 noon.    [provider]  ibuprofen (ADVIL,MOTRIN) 600 MG tablet Take 1 tablet (600 mg total) by mouth every 6 (six) hours as needed. 05/12/18   Nita Sickle, MD  oxybutynin (DITROPAN) 5 MG tablet Take 1 tablet (5 mg total) by mouth 3 (three) times daily. 05/16/18   Sharyn Creamer, MD    Allergies Patient has no known allergies.  No family history on file.  Social History Social History   Tobacco Use  . Smoking status: Current Every Day Smoker    Packs/day: 0.50    Types: Cigarettes  . Smokeless tobacco: Never Used  Vaping Use  . Vaping Use: Never used  Substance Use Topics  . Alcohol use: No  . Drug use: No     Review of Systems  Constitutional: No  fever/chills Cardiovascular: No chest pain. Respiratory: No SOB. Gastrointestinal: No abdominal pain.  No nausea, no vomiting.  Musculoskeletal: Positive for neck and arm pain. Skin: Negative for rash, abrasions, lacerations, ecchymosis. Neurological: Negative for headaches. Positive for numbness or tingling   ____________________________________________   PHYSICAL EXAM:  VITAL SIGNS: ED Triage Vitals  Enc Vitals Group     BP 11/27/19 0825 (!) 144/101     Pulse Rate 11/27/19 0825 84     Resp 11/27/19 0825 20     Temp 11/27/19 0825 98.2 F (36.8 C)     Temp Source 11/27/19 0825 Oral     SpO2 11/27/19 0825 100 %     Weight 11/27/19 0830 200 lb (90.7 kg)     Height 11/27/19 0830 5\' 5"  (1.651 m)     Head Circumference --      Peak Flow --      Pain Score 11/27/19 0829 10     Pain Loc --      Pain Edu? --      Excl. in GC? --      Constitutional: Alert and oriented. Well appearing and in no acute distress. Eyes: Conjunctivae are normal. PERRL. EOMI. Head: Atraumatic. ENT:      Ears:      Nose: No congestion/rhinnorhea.      Mouth/Throat:  Mucous membranes are moist.  Neck: No stridor. No cervical spine tenderness to palpation. Cardiovascular: Normal rate, regular rhythm.  Good peripheral circulation. Respiratory: Normal respiratory effort without tachypnea or retractions. Lungs CTAB. Good air entry to the bases with no decreased or absent breath sounds. Gastrointestinal: Bowel sounds 4 quadrants. Soft and nontender to palpation. No guarding or rigidity. No palpable masses. No distention.  Musculoskeletal: Full range of motion to all extremities. No gross deformities appreciated. Neurologic:  Normal speech and language. No gross focal neurologic deficits are appreciated.  Skin:  Skin is warm, dry and intact. No rash noted. Psychiatric: Mood and affect are normal. Speech and behavior are normal. Patient exhibits appropriate insight and  judgement.   ____________________________________________   LABS (all labs ordered are listed, but only abnormal results are displayed)  Labs Reviewed - No data to display ____________________________________________  EKG   ____________________________________________  RADIOLOGY Lexine Baton, personally viewed and evaluated these images (plain radiographs) as part of my medical decision making, as well as reviewing the written report by the radiologist.  DG Cervical Spine 2-3 Views  Result Date: 11/27/2019 CLINICAL DATA:  Arm pain, presented to ED with LEFT shoulder and LEFT-sided neck pain for 3 weeks EXAM: CERVICAL SPINE - 2-3 VIEW COMPARISON:  Chest x-ray May 12, 2018 FINDINGS: Prevertebral soft tissues are normal. Moderate reversal of normal cervical lordotic curvature with some facet arthropathy. Most pronounced at C4-5. No sign of fracture. Cervical spine image through T1. Incidental note is made of healed fractures about the RIGHT chest partially imaged. IMPRESSION: 1. Moderate reversal of normal cervical lordotic curvature with some mild facet arthropathy. This is likely related to degenerative changes. 2. No no sign of fracture. Electronically Signed   By: Donzetta Kohut M.D.   On: 11/27/2019 10:58   US Venous Img Upper Uni Left  Result Date: 11/27/2019 CLINICAL DATA:  Left upper extremity pain. EXAM: LEFT UPPER EXTREMITY VENOUS DOPPLER ULTRASOUND TECHNIQUE: Gray-scale sonography with graded compression, as well as color Doppler and duplex ultrasound were performed to evaluate the upper extremity deep venous system from the level of the subclavian vein and including the jugular, axillary, basilic, radial, ulnar and upper cephalic vein. Spectral Doppler was utilized to evaluate flow at rest and with distal augmentation maneuvers. COMPARISON:  None. FINDINGS: Contralateral Subclavian Vein: Patent with color Doppler flow and normal phasicity. Internal Jugular Vein: No evidence  of thrombus. Normal compressibility, color Doppler flow and phasicity. Subclavian Vein: No evidence of thrombus. Normal compressibility, color Doppler flow and phasicity. Axillary Vein: No evidence of thrombus. Normal compressibility, color Doppler flow and augmentation. Cephalic Vein: No evidence of thrombus. Normal compressibility, respiratory phasicity and response to augmentation. Basilic Vein: No evidence of thrombus. Normal compressibility, respiratory phasicity and response to augmentation. Brachial Veins: No evidence of thrombus. Normal compressibility, respiratory phasicity and response to augmentation. Radial Veins: No evidence of thrombus. Normal compressibility and color Doppler flow. Ulnar Veins: No evidence of thrombus. Normal compressibility and color Doppler flow. Venous Reflux:  None visualized. Other Findings:  None visualized. IMPRESSION: No evidence of DVT within the left upper extremity. Electronically Signed   By: Richarda Overlie M.D.   On: 11/27/2019 12:00    ____________________________________________    PROCEDURES  Procedure(s) performed:    Procedures    Medications  acetaminophen (TYLENOL) tablet 1,000 mg (has no administration in time range)     ____________________________________________   INITIAL IMPRESSION / ASSESSMENT AND PLAN / ED COURSE  Pertinent labs & imaging results that  were available during my care of the patient were reviewed by me and considered in my medical decision making (see chart for details).  Review of the [place] CSRS was performed in accordance of the [facility] prior to dispensing any controlled drugs.     Patient presented to the emergency department for evaluation of left-sided neck pain and left arm pain for 3 weeks.  Vital signs are reassuring.  Exam is most consistent with radiculopathy.  Ultrasound and x-ray was ordered to further evaluate.  Patient eloped prior to receiving any results.    ZUHA DEJONGE was evaluated in Emergency  Department on 11/27/2019 for the symptoms described in the history of present illness. She was evaluated in the context of the global COVID-19 pandemic, which necessitated consideration that the patient might be at risk for infection with the SARS-CoV-2 virus that causes COVID-19. Institutional protocols and algorithms that pertain to the evaluation of patients at risk for COVID-19 are in a state of rapid change based on information released by regulatory bodies including the CDC and federal and state organizations. These policies and algorithms were followed during the patient's care in the ED.     ____________________________________________  FINAL CLINICAL IMPRESSION(S) / ED DIAGNOSES  Final diagnoses:  Left arm pain      NEW MEDICATIONS STARTED DURING THIS VISIT:  ED Discharge Orders    None          This chart was dictated using voice recognition software/Dragon. Despite best efforts to proofread, errors can occur which can change the meaning. Any change was purely unintentional.    Enid Derry, PA-C 11/27/19 1551    Merwyn Katos, MD 11/27/19 [phone]

## 2019-11-27 NOTE — ED Notes (Signed)
See triage note  Presents with pain to left shoulder area   Pain started about 3 weeks agoNo injury

## 2019-11-27 NOTE — ED Triage Notes (Signed)
Patient presents to the ED with left shoulder and left sided neck pain x 3 weeks.  Patient states pain is worse today.

## 2019-12-07 ENCOUNTER — Emergency Department
Admission: EM | Admit: 2019-12-07 | Discharge: 2019-12-07 | Discharge status: Absent without leave | Payer: Medicaid Other

## 2019-12-24 DIAGNOSIS — M5020 Other cervical disc displacement, unspecified cervical region: Secondary | ICD-10-CM | POA: Insufficient documentation

## 2020-02-07 DIAGNOSIS — F310 Bipolar disorder, current episode hypomanic: Secondary | ICD-10-CM | POA: Insufficient documentation

## 2020-02-07 DIAGNOSIS — F4310 Post-traumatic stress disorder, unspecified: Secondary | ICD-10-CM | POA: Insufficient documentation

## 2020-02-23 IMAGING — CR DG CHEST 2V
1 series · 2 of 2 positions shown · non-contrast
Comparison: Radiographs January 06, 2016.

CLINICAL DATA: Cough, fever.

EXAM:
CHEST - 2 VIEW

[Series 1: w chest pa · 0.14mm/px · 2 of 2 slices shown]
[im 1/2]
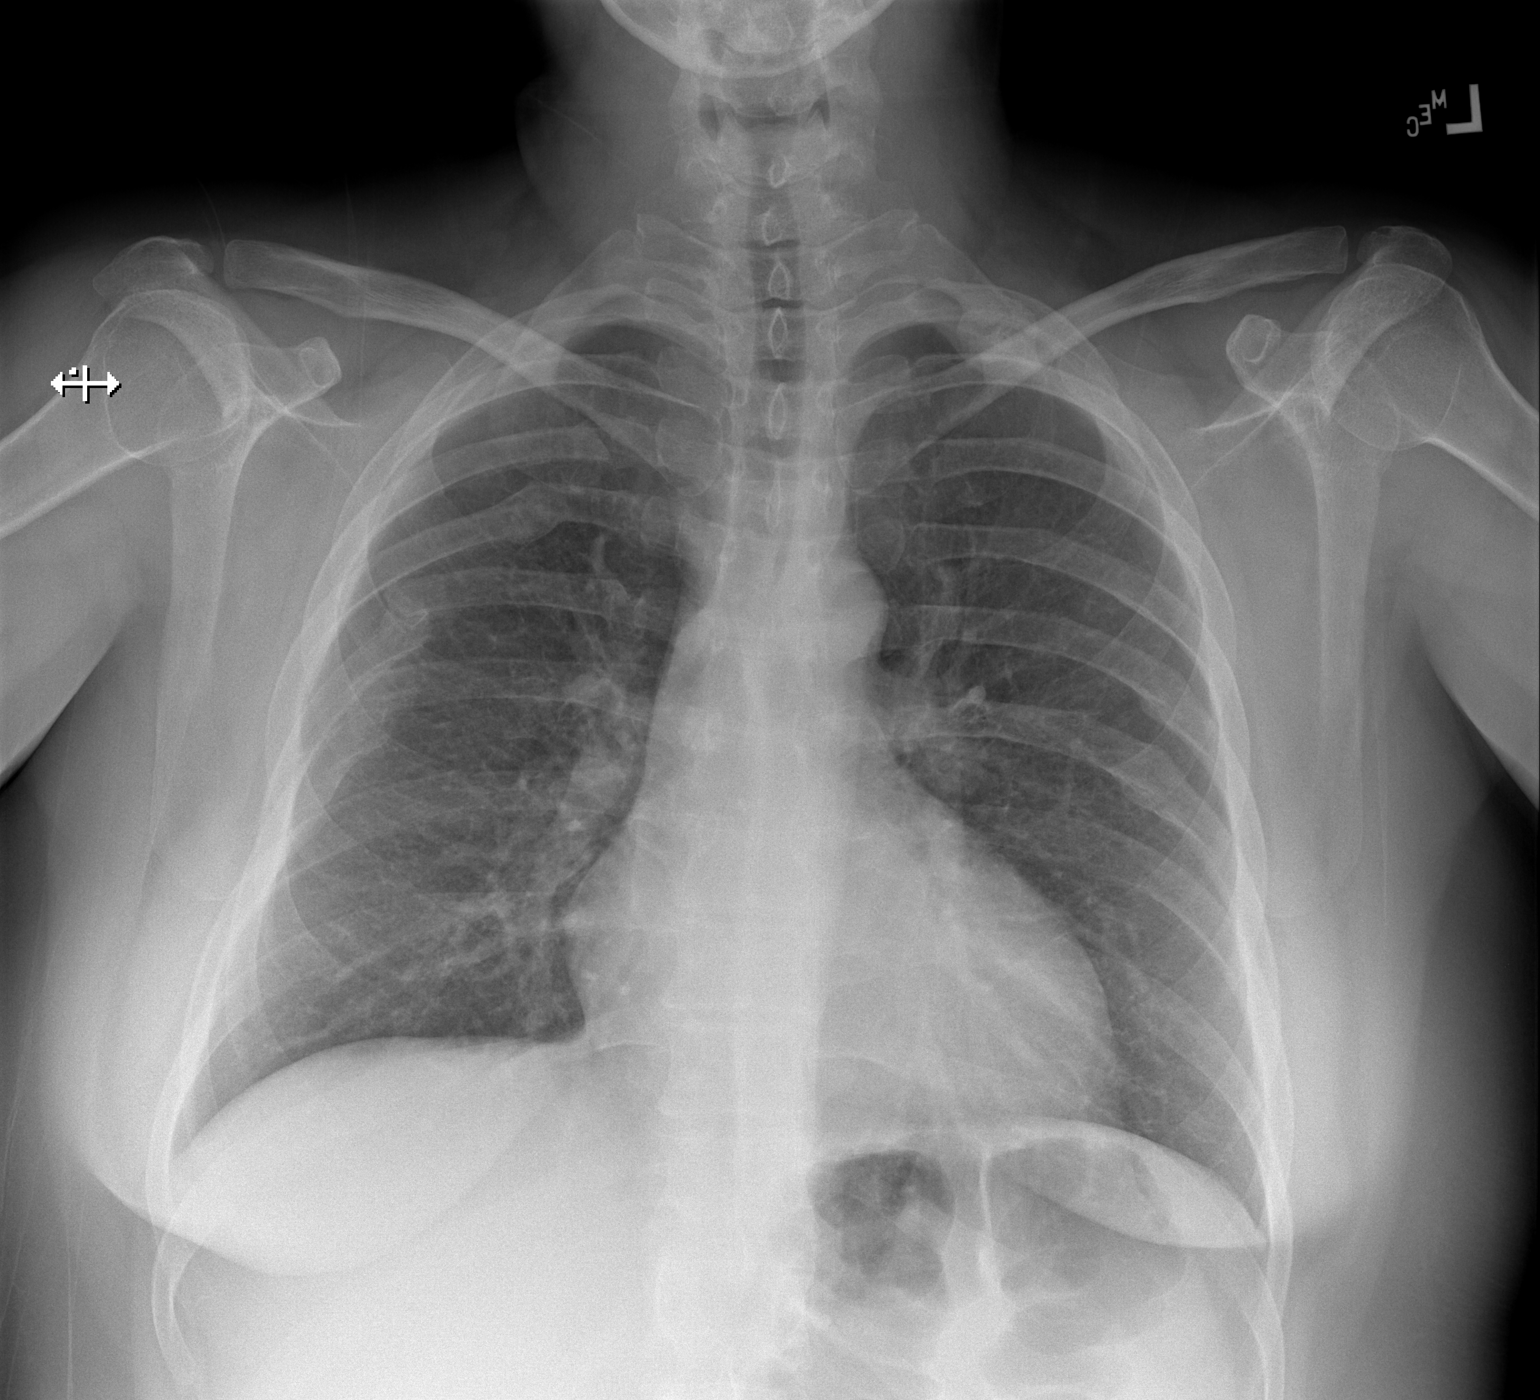
[im 2/2]
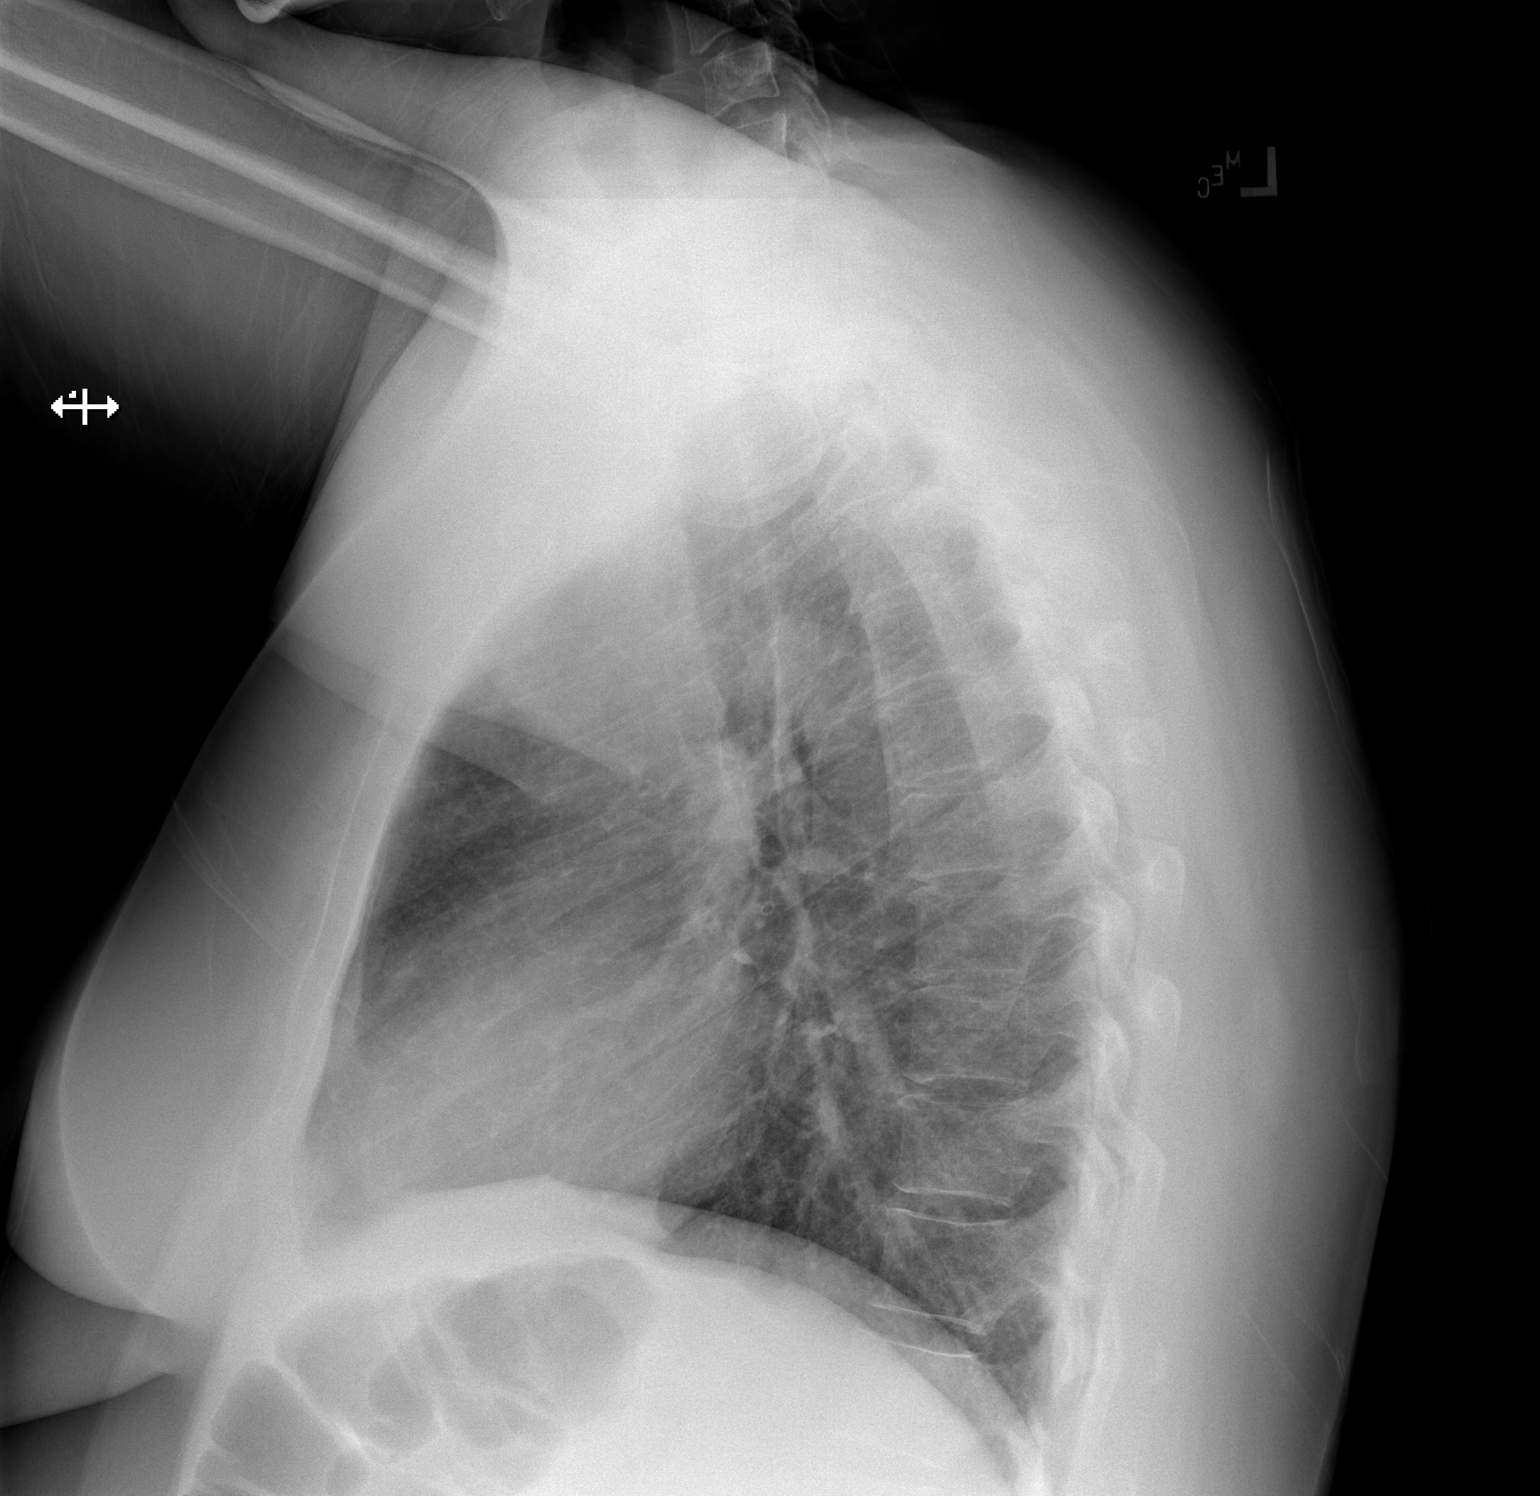

[2 of 2 positions shown; findings below may reference images not displayed]

FINDINGS: The heart size and mediastinal contours are within normal limits.
Both lungs are clear. Old bilateral rib fractures are noted.
IMPRESSION: No active cardiopulmonary disease.

## 2020-02-27 IMAGING — CT CT RENAL STONE PROTOCOL
2 of 4 series · 16 of 46 positions shown, 18 images · non-contrast
Comparison: 08/20/2017.

CLINICAL DATA: 37-year-old presenting with three-week history of
pelvic pain. Surgical history includes cesarean section x4 and
BILATERAL tubal ligation. Personal history of urinary tract calculi.

EXAM:
CT ABDOMEN AND PELVIS WITHOUT CONTRAST
TECHNIQUE: Multidetector CT imaging of the abdomen and pelvis was performed
following the standard protocol without IV contrast.

[Series 2: stone full standard · axial · 0.79mm/px · z∈[-530,-110]mm · 13 of 92 slices shown, 15 images]
[im 4/92  soft-tissue]
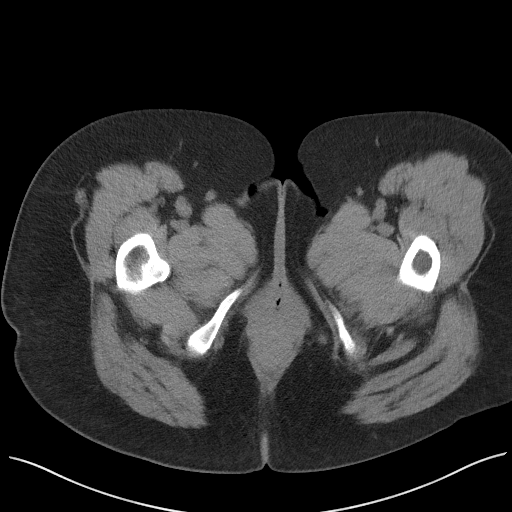
[im 4/92  bone]
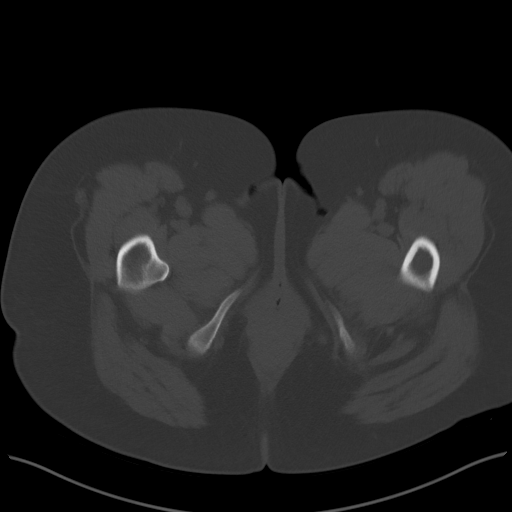
[im 11/92  soft-tissue]
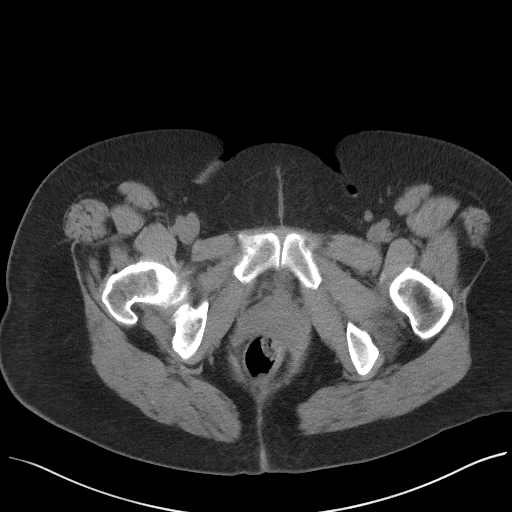
[im 19/92  soft-tissue]
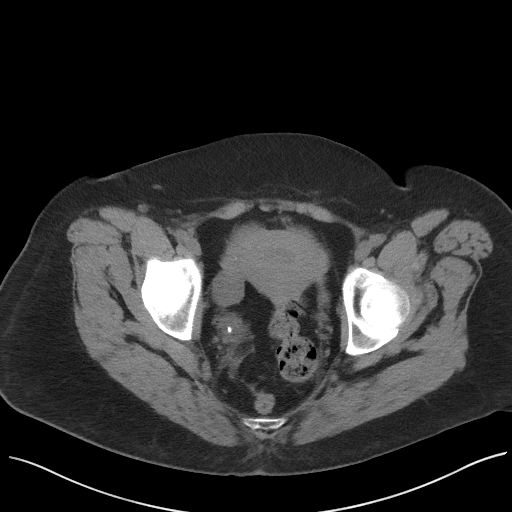
[im 26/92  soft-tissue]
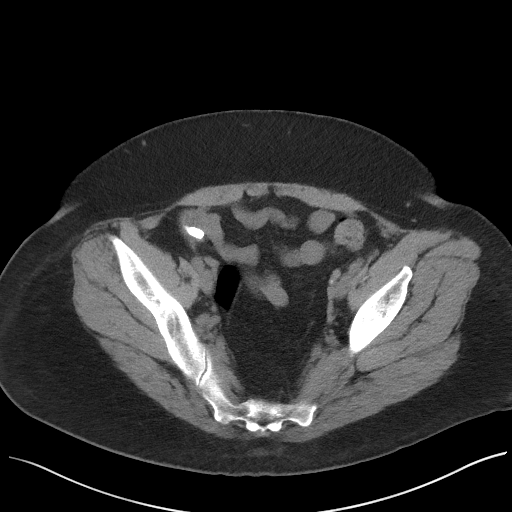
[im 33/92  soft-tissue]
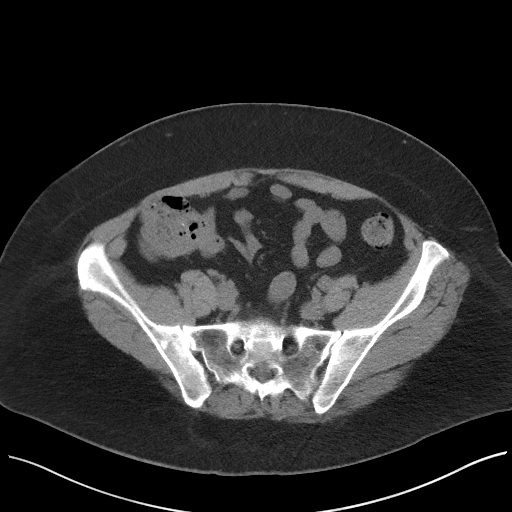
[im 41/92  soft-tissue]
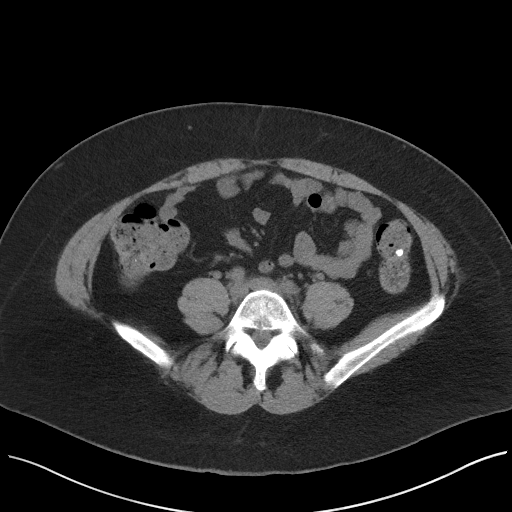
[im 48/92  soft-tissue]
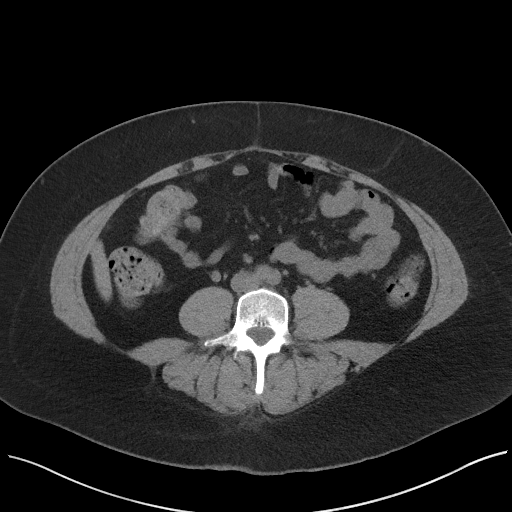
[im 51/92  soft-tissue]
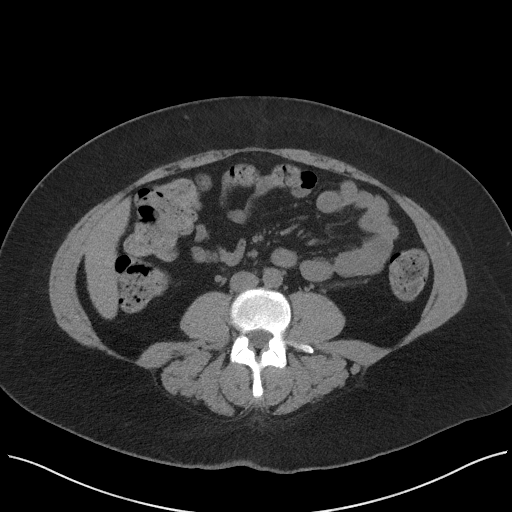
[im 59/92  soft-tissue]
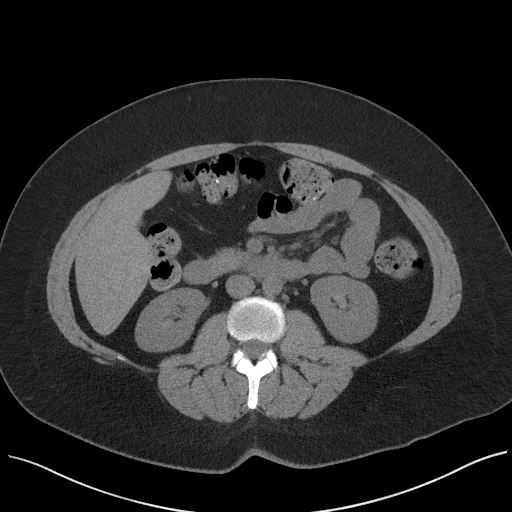
[im 59/92  bone]
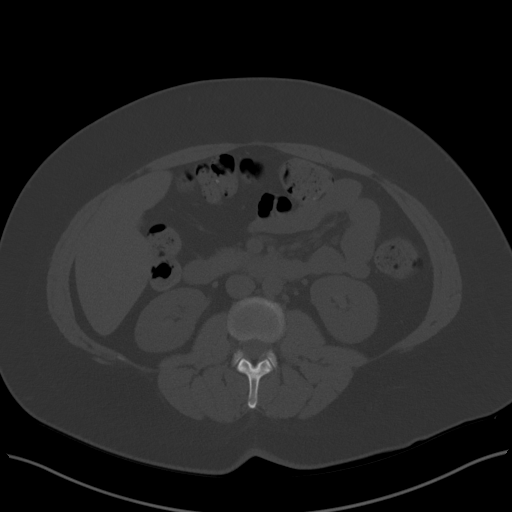
[im 66/92  soft-tissue]
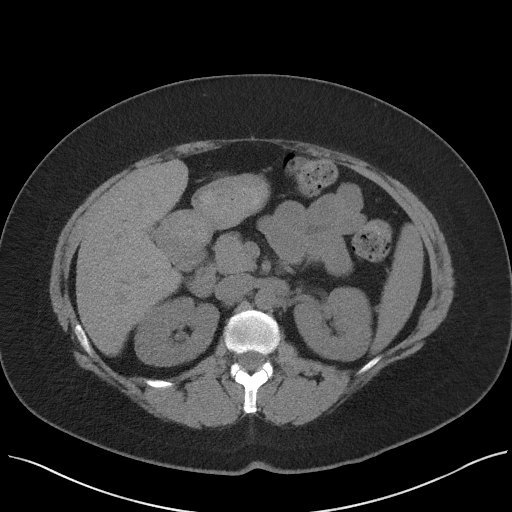
[im 73/92  soft-tissue]
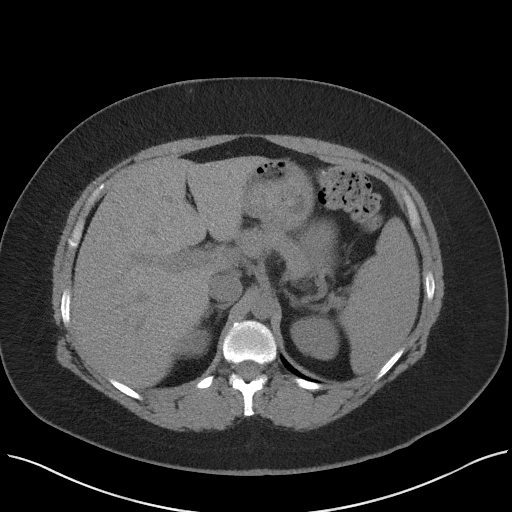
[im 81/92  soft-tissue]
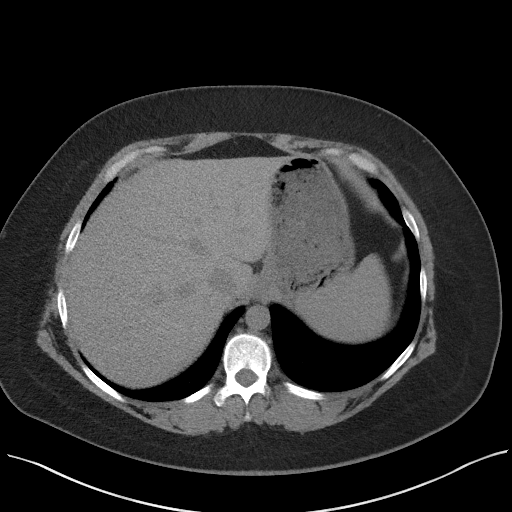
[im 88/92  soft-tissue]
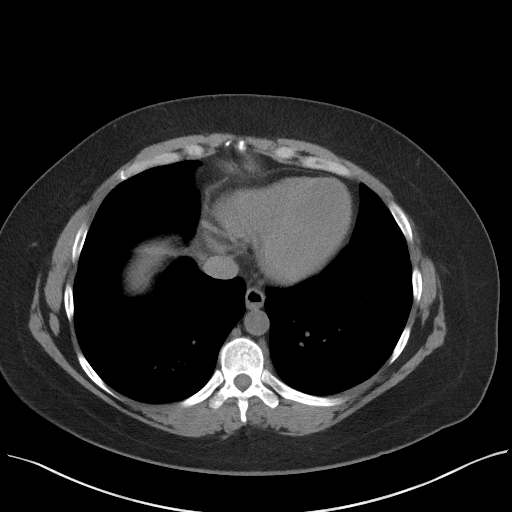

[Series 5: coronal · coronal · 0.84mm/px · 3 of 151 slices shown]
[im 51/151  soft-tissue]
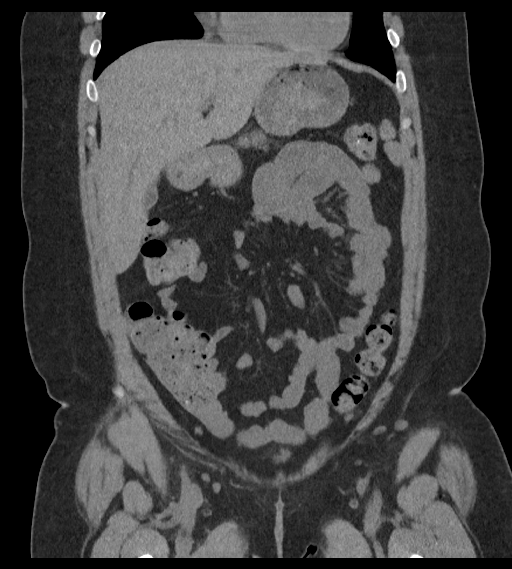
[im 67/151  soft-tissue]
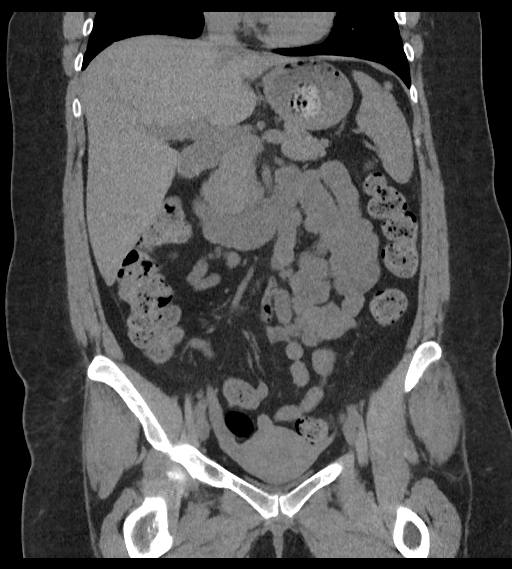
[im 84/151  soft-tissue]
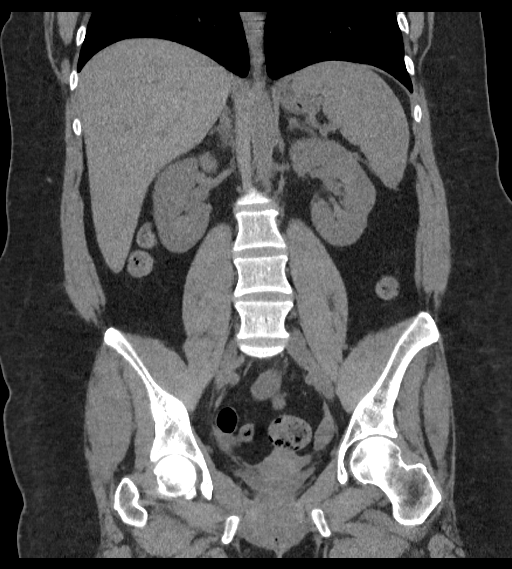

[16 of 46 positions shown; findings below may reference images not displayed]

FINDINGS: Lower chest: Heart size normal.  Visualized lung bases clear.

Hepatobiliary: Normal unenhanced appearance of the liver. Prominent
Riedel's lobe as noted previously. Contracted gallbladder which is
otherwise normal in appearance. No biliary ductal dilation.

Pancreas: Normal unenhanced appearance.

Spleen: Normal unenhanced appearance.

Adrenals/Urinary Tract: Normal appearing adrenal glands. Very small
(1-2 mm) non-obstructing calculi in mid calices of both kidneys and
in a LOWER pole calyx of the RIGHT kidney, best seen on the coronal
images. No evidence of an obstructing ureteral calculus on either
side. Normal unenhanced appearance of the kidneys otherwise. Urinary
bladder decompressed and unremarkable.

Stomach/Bowel: Normal appearing stomach filled with food, accounting
for the contracted gallbladder. Normal-appearing small bowel. Opaque
ingested material throughout the colon. Moderate colonic stool
burden diffusely. No evidence of diverticulosis or other focal
abnormality involving the colon. Appendix not clearly identified,
but no evidence of pericecal inflammation.

Vascular/Lymphatic: No visible atherosclerosis. No pathologic
lymphadenopathy.

Reproductive: Normal unenhanced appearance of the uterus and ovaries
without evidence of adnexal mass.

Other: Numerous pelvic phleboliths.

Musculoskeletal: Regional skeleton unremarkable without acute or
significant osseous abnormality.
IMPRESSION: 1. No acute abnormalities involving the abdomen or pelvis.
2. Very small (1-2 mm) non-obstructing calculi in mid calices of
both kidneys and in a lower pole calyx of the RIGHT kidney.

## 2020-07-03 ENCOUNTER — Encounter: Payer: Self-pay | Admitting: Emergency Medicine

## 2020-07-03 ENCOUNTER — Emergency Department
Admission: EM | Admit: 2020-07-03 | Discharge: 2020-07-03 | Disposition: A | Discharge status: Home / Self Care | Payer: Medicaid Other | Attending: Emergency Medicine | Admitting: Emergency Medicine

## 2020-07-03 ENCOUNTER — Other Ambulatory Visit: Payer: Self-pay

## 2020-07-03 DIAGNOSIS — M545 Low back pain, unspecified: Secondary | ICD-10-CM | POA: Insufficient documentation

## 2020-07-03 DIAGNOSIS — F17210 Nicotine dependence, cigarettes, uncomplicated: Secondary | ICD-10-CM | POA: Insufficient documentation

## 2020-07-03 DIAGNOSIS — G8929 Other chronic pain: Secondary | ICD-10-CM | POA: Diagnosis not present

## 2020-07-03 DIAGNOSIS — J45909 Unspecified asthma, uncomplicated: Secondary | ICD-10-CM | POA: Diagnosis not present

## 2020-07-03 LAB — URINE DRUG SCREEN, QUALITATIVE (ARMC ONLY)
Amphetamines, Ur Screen: NOT DETECTED
Barbiturates, Ur Screen: NOT DETECTED
Benzodiazepine, Ur Scrn: NOT DETECTED
Cannabinoid 50 Ng, Ur ~~LOC~~: NOT DETECTED
Cocaine Metabolite,Ur ~~LOC~~: NOT DETECTED
MDMA (Ecstasy)Ur Screen: NOT DETECTED
Methadone Scn, Ur: NOT DETECTED
Opiate, Ur Screen: NOT DETECTED
Phencyclidine (PCP) Ur S: NOT DETECTED
Tricyclic, Ur Screen: POSITIVE — AB

## 2020-07-03 LAB — URINALYSIS, COMPLETE (UACMP) WITH MICROSCOPIC
Bacteria, UA: NONE SEEN
Bilirubin Urine: NEGATIVE
Glucose, UA: NEGATIVE mg/dL
Hgb urine dipstick: NEGATIVE
Ketones, ur: NEGATIVE mg/dL
Leukocytes,Ua: NEGATIVE
Nitrite: NEGATIVE
Protein, ur: NEGATIVE mg/dL
Specific Gravity, Urine: 1.013 (ref 1.005–1.030)
pH: 5 (ref 5.0–8.0)

## 2020-07-03 LAB — POC URINE PREG, ED: Preg Test, Ur: NEGATIVE

## 2020-07-03 MED ORDER — LIDOCAINE 5 % EX PTCH
1.0000 | MEDICATED_PATCH | Freq: Once | CUTANEOUS | Status: DC
  Administered 2020-07-03: 1 via TRANSDERMAL
  Filled 2020-07-03: qty 1

## 2020-07-03 MED ORDER — ORPHENADRINE CITRATE 30 MG/ML IJ SOLN
60.0000 mg | INTRAMUSCULAR | Status: AC
  Administered 2020-07-03: 60 mg via INTRAMUSCULAR
  Filled 2020-07-03: qty 2

## 2020-07-03 MED ORDER — KETOROLAC TROMETHAMINE 30 MG/ML IJ SOLN
30.0000 mg | Freq: Once | INTRAMUSCULAR | Status: AC
  Administered 2020-07-03: 30 mg via INTRAMUSCULAR
  Filled 2020-07-03: qty 1

## 2020-07-03 MED ORDER — GABAPENTIN 300 MG PO CAPS: 900.0000 mg | ORAL_CAPSULE | Freq: Three times a day (TID) | ORAL | 0 refills | Status: DC

## 2020-07-03 MED ORDER — ORPHENADRINE CITRATE ER 100 MG PO TB12: 100.0000 mg | ORAL_TABLET | Freq: Two times a day (BID) | ORAL | 0 refills | Status: DC | PRN

## 2020-07-03 MED ORDER — LIDOCAINE 5 % EX PTCH: 1.0000 | MEDICATED_PATCH | Freq: Two times a day (BID) | CUTANEOUS | 0 refills | Status: AC | PRN

## 2020-07-03 NOTE — ED Provider Notes (Incomplete)
[facility] Emergency Department Provider Note ____________________________________________  Time seen: ***  I have reviewed the triage vital signs and the nursing notes.  HISTORY  Chief Complaint  Back Pain   HPI Kayla Mcgee is a 40 y.o. female ***   Gabapentin 600 mg TID  Past Medical History:  Diagnosis Date  . Anxiety   . Asthma   . H. pylori infection   . Panic attacks   . Seasonal allergies     There are no problems to display for this patient.   Past Surgical History:  Procedure Laterality Date  . CESAREAN SECTION    . TONSILLECTOMY    . TUBAL LIGATION      Prior to Admission medications   Medication Sig Start Date End Date Taking? Authorizing Provider  esomeprazole (NEXIUM) 20 MG capsule Take 20 mg by mouth daily at 12 noon.    [provider]  ibuprofen (ADVIL,MOTRIN) 600 MG tablet Take 1 tablet (600 mg total) by mouth every 6 (six) hours as needed. 05/12/18   Nita Sickle, MD  oxybutynin (DITROPAN) 5 MG tablet Take 1 tablet (5 mg total) by mouth 3 (three) times daily. 05/16/18   Sharyn Creamer, MD    Allergies Sulfa antibiotics  History reviewed. No pertinent family history.  Social History Social History   Tobacco Use  . Smoking status: Current Every Day Smoker    Packs/day: 0.50    Types: Cigarettes  . Smokeless tobacco: Never Used  Vaping Use  . Vaping Use: Never used  Substance Use Topics  . Alcohol use: No  . Drug use: No    Review of Systems  Constitutional: Negative for fever. Cardiovascular: Negative for chest pain. Respiratory: Negative for shortness of breath. Gastrointestinal: Negative for abdominal pain, vomiting and diarrhea. Genitourinary: Negative for dysuria. Musculoskeletal: Positive for back pain. Skin: Negative for rash. Neurological: Negative for headaches, focal weakness or numbness. ____________________________________________  PHYSICAL EXAM:  VITAL SIGNS: ED Triage  Vitals  Enc Vitals Group     BP 07/03/20 1847 112/90     Pulse Rate 07/03/20 1845 97     Resp 07/03/20 1845 20     Temp 07/03/20 1845 99 F (37.2 C)     Temp Source 07/03/20 1845 Oral     SpO2 07/03/20 1845 95 %     Weight 07/03/20 1846 230 lb (104.3 kg)     Height 07/03/20 1846 5\' 5"  (1.651 m)     Head Circumference --      Peak Flow --      Pain Score 07/03/20 1846 8     Pain Loc --      Pain Edu? --      Excl. in GC? --     Constitutional: Alert and oriented. Well appearing and in no distress. Head: Normocephalic and atraumatic. Eyes: Conjunctivae are normal. Normal extraocular movements Neck: Supple. No thyromegaly. Cardiovascular: Normal rate, regular rhythm. Normal distal pulses. Respiratory: Normal respiratory effort. No wheezes/rales/rhonchi. Gastrointestinal: Soft and nontender. No distention. Musculoskeletal: Nontender with normal range of motion in all extremities.  Neurologic:  Normal gait without ataxia. Normal speech and language. No gross focal neurologic deficits are appreciated. Skin:  Skin is warm, dry and intact. No rash noted. Psychiatric: Mood and affect are normal. Patient exhibits appropriate insight and judgment. ____________________________________________    {***LABS (pertinent positives/negatives)***} ____________________________________________   {***RADIOLOGY***}  ____________________________________________  PROCEDURES  *** Procedures ____________________________________________  INITIAL IMPRESSION / ASSESSMENT AND PLAN / ED COURSE  {***summary/treatment  plan - medications ?***}     Kayla Mcgee was evaluated in Emergency Department on 07/03/2020 for the symptoms described in the history of present illness. She was evaluated in the context of the global COVID-19 pandemic, which necessitated consideration that the patient might be at risk for infection with the SARS-CoV-2 virus that causes COVID-19. Institutional protocols and  algorithms that pertain to the evaluation of patients at risk for COVID-19 are in a state of rapid change based on information released by regulatory bodies including the CDC and federal and state organizations. These policies and algorithms were followed during the patient's care in the ED.  I reviewed the patient's prescription history over the last 12 months in the multi-state controlled substances database(s) that includes [place], [place], [place], [place], [place], [place], [place], [place], [place], [place], [place], [place], [place], and [place].  Results were notable for *** ____________________________________________  FINAL CLINICAL IMPRESSION(S) / ED DIAGNOSES  Final diagnoses:  None

## 2020-07-03 NOTE — ED Triage Notes (Signed)
Pt in via POV, sent over from [facility].  Reports having MRI yesterday, being called back today with results.  Reports multiple bulging discs w/ one ruptured.  Ambulatory to triage, NAD noted at this time.

## 2020-07-03 NOTE — ED Provider Notes (Signed)
[facility] Emergency Department Provider Note ____________________________________________  Time seen: 1930  I have reviewed the triage vital signs and the nursing notes.  HISTORY  Chief Complaint  Back Pain   HPI Kayla Mcgee is a 40 y.o. female presents her self to the ED for evaluation management of acute on chronic low back pain.  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  She is in the process of being referred to a local pain management specialist.  She reports she is at increased dosing of her gabapentin, and is currently out of her tablets until her next refill is due in 2 weeks.  She denies any bladder or bowel incontinence, foot drop, or saddle anesthesia.  She also denies any interim trauma or falls.   She reports she had called her primary provider due to her increased pain, and was referred to the ED for pain management.  Past Medical History:  Diagnosis Date  . Anxiety   . Asthma   . H. pylori infection   . Panic attacks   . Seasonal allergies     There are no problems to display for this patient.   Past Surgical History:  Procedure Laterality Date  . CESAREAN SECTION    . TONSILLECTOMY    . TUBAL LIGATION      Prior to Admission medications   Medication Sig Start Date End Date Taking? Authorizing Provider  gabapentin (NEURONTIN) 300 MG capsule Take 3 capsules (900 mg total) by mouth 3 (three) times daily for 15 days. 07/03/20 07/18/20 Yes Salvador Coupe, Charlesetta Ivory, PA-C  lidocaine (LIDODERM) 5 % Place 1 patch onto the skin every 12 (twelve) hours as needed for up to 10 days. Remove & Discard patch after 12 hours of wear each day. 07/03/20 07/13/20 Yes Jeremy Ditullio, Charlesetta Ivory, PA-C  orphenadrine (NORFLEX) 100 MG tablet Take 1 tablet (100 mg total) by mouth 2 (two) times daily as needed for muscle spasms. 07/03/20  Yes Telia Amundson, Charlesetta Ivory, PA-C  esomeprazole (NEXIUM) 20 MG capsule  Take 20 mg by mouth daily at 12 noon.    [provider]  ibuprofen (ADVIL,MOTRIN) 600 MG tablet Take 1 tablet (600 mg total) by mouth every 6 (six) hours as needed. 05/12/18   Nita Sickle, MD  oxybutynin (DITROPAN) 5 MG tablet Take 1 tablet (5 mg total) by mouth 3 (three) times daily. 05/16/18   Sharyn Creamer, MD    Allergies Sulfa antibiotics  History reviewed. No pertinent family history.  Social History Social History   Tobacco Use  . Smoking status: Current Every Day Smoker    Packs/day: 0.50    Types: Cigarettes  . Smokeless tobacco: Never Used  Vaping Use  . Vaping Use: Never used  Substance Use Topics  . Alcohol use: No  . Drug use: No    Review of Systems  Constitutional: Negative for fever. Eyes: Negative for visual changes. ENT: Negative for sore throat. Cardiovascular: Negative for chest pain. Respiratory: Negative for shortness of breath. Gastrointestinal: Negative for abdominal pain, vomiting and diarrhea. Genitourinary: Negative for dysuria. Musculoskeletal: Reports acute flare of chronic back pain. Skin: Negative for rash. Neurological: Negative for headaches, focal weakness or numbness. ____________________________________________  PHYSICAL EXAM:  VITAL SIGNS: ED Triage Vitals  Enc Vitals Group     BP 07/03/20 1847 112/90     Pulse Rate 07/03/20 1845 97     Resp 07/03/20 1845  20     Temp 07/03/20 1845 99 F (37.2 C)     Temp Source 07/03/20 1845 Oral     SpO2 07/03/20 1845 95 %     Weight 07/03/20 1846 230 lb (104.3 kg)     Height 07/03/20 1846 5\' 5"  (1.651 m)     Head Circumference --      Peak Flow --      Pain Score 07/03/20 1846 8     Pain Loc --      Pain Edu? --      Excl. in GC? --     Constitutional: Alert and oriented. Well appearing and in no distress. Head: Normocephalic and atraumatic. Eyes: Conjunctivae are normal.  Normal extraocular movements Cardiovascular: Normal rate, regular rhythm. Normal distal  pulses. Respiratory: Normal respiratory effort. No wheezes/rales/rhonchi. Gastrointestinal: Soft and nontender. No distention. Musculoskeletal: Normal spinal alignment without midline tenderness, spasm, vomiting, or step-off.  Patient transitions from supine to sit without assistance.  Nontender with normal range of motion in all extremities.  Neurologic: Cranial nerves II to XII grossly intact.  Normal LV DTRs bilaterally.  Normal toe dorsiflexion exam.  Normal gait without ataxia. Normal speech and language. No gross focal neurologic deficits are appreciated. Skin:  Skin is warm, dry and intact. No rash noted. Psychiatric: Mood and affect are normal. Patient exhibits appropriate insight and judgment. ____________________________________________   LABS (pertinent positives/negatives) Labs Reviewed  URINALYSIS, COMPLETE (UACMP) WITH MICROSCOPIC - Abnormal; Notable for the following components:      Result Value   Color, Urine YELLOW (*)    APPearance CLEAR (*)    All other components within normal limits  URINE DRUG SCREEN, QUALITATIVE ([facility] ONLY) - Abnormal; Notable for the following components:   Tricyclic, Ur Screen POSITIVE (*)    All other components within normal limits  POC URINE PREG, ED   ____________________________________________   RADIOLOGY  MRI report not available for review  ____________________________________________  PROCEDURES  Toradol 30 mg IM Norflex 60 gm IM Lidoderm 5% transdermal patch  Procedures ____________________________________________  INITIAL IMPRESSION / ASSESSMENT AND PLAN / ED COURSE  Patient ED evaluation of acute on chronic low back pain with a history of DDD.  She reports a recent MRI with confirmation of a HNP.  She presents today after she was told to report to the ED for acute on chronic pain.  Patient with no acute findings or red flags on exam.  She is requesting some pain intervention.  She is requesting a injection and has received  that along with Norflex injection and Lidoderm patch.  Prescriptions for the same are provided.  She is also given a prescription for gabapentin at an increased dose of 900 mg 3 times daily.  She will not ramp up the dosing up as discussed.  She is referred to her primary provider or pain management specialist for ongoing symptom management.  Return precautions have been discussed.    Kayla Mcgee was evaluated in Emergency Department on 07/03/2020 for the symptoms described in the history of present illness. She was evaluated in the context of the global COVID-19 pandemic, which necessitated consideration that the patient might be at risk for infection with the SARS-CoV-2 virus that causes COVID-19. Institutional protocols and algorithms that pertain to the evaluation of patients at risk for COVID-19 are in a state of rapid change based on information released by regulatory bodies including the CDC and federal and state organizations. These policies and algorithms were followed during  the patient's care in the ED. ____________________________________________  FINAL CLINICAL IMPRESSION(S) / ED DIAGNOSES  Final diagnoses:  Chronic bilateral low back pain without sciatica      Karmen Stabs, Charlesetta Ivory, PA-C 07/03/20 2331    Sharman Cheek, MD 07/04/20 0006

## 2020-07-03 NOTE — Discharge Instructions (Addendum)
Take prescription meds as directed.  Follow-up primary provider or specialist for ongoing symptom management.

## 2020-07-07 ENCOUNTER — Other Ambulatory Visit: Payer: Self-pay

## 2020-07-07 ENCOUNTER — Emergency Department
Admission: EM | Admit: 2020-07-07 | Discharge: 2020-07-07 | Disposition: A | Discharge status: Home / Self Care | Payer: Medicaid Other | Attending: Emergency Medicine | Admitting: Emergency Medicine

## 2020-07-07 DIAGNOSIS — R441 Visual hallucinations: Secondary | ICD-10-CM | POA: Diagnosis not present

## 2020-07-07 DIAGNOSIS — J45909 Unspecified asthma, uncomplicated: Secondary | ICD-10-CM | POA: Diagnosis not present

## 2020-07-07 DIAGNOSIS — F17210 Nicotine dependence, cigarettes, uncomplicated: Secondary | ICD-10-CM | POA: Insufficient documentation

## 2020-07-07 DIAGNOSIS — R440 Auditory hallucinations: Secondary | ICD-10-CM | POA: Insufficient documentation

## 2020-07-07 DIAGNOSIS — F329 Major depressive disorder, single episode, unspecified: Secondary | ICD-10-CM | POA: Insufficient documentation

## 2020-07-07 DIAGNOSIS — G4700 Insomnia, unspecified: Secondary | ICD-10-CM | POA: Diagnosis not present

## 2020-07-07 DIAGNOSIS — R443 Hallucinations, unspecified: Secondary | ICD-10-CM

## 2020-07-07 LAB — ACETAMINOPHEN LEVEL: Acetaminophen (Tylenol), Serum: <10 ug/mL — ABNORMAL LOW (ref 10–30)

## 2020-07-07 LAB — COMPREHENSIVE METABOLIC PANEL
ALT: 21 U/L (ref 0–44)
AST: 21 U/L (ref 15–41)
Albumin: 3.4 g/dL — ABNORMAL LOW (ref 3.5–5.0)
Alkaline Phosphatase: 79 U/L (ref 38–126)
Anion gap: 9 (ref 5–15)
BUN: 10 mg/dL (ref 6–20)
CO2: 24 mmol/L (ref 22–32)
Calcium: 8.4 mg/dL — ABNORMAL LOW (ref 8.9–10.3)
Chloride: 104 mmol/L (ref 98–111)
Creatinine, Ser: 0.65 mg/dL (ref 0.44–1.00)
GFR, Estimated: >60 mL/min (ref >60)
Glucose, Bld: 131 mg/dL — ABNORMAL HIGH (ref 70–99)
Potassium: 3.7 mmol/L (ref 3.5–5.1)
Sodium: 137 mmol/L (ref 135–145)
Total Bilirubin: 0.4 mg/dL (ref 0.3–1.2)
Total Protein: 6.8 g/dL (ref 6.5–8.1)

## 2020-07-07 LAB — CBC
HCT: 34.4 % — ABNORMAL LOW (ref 36.0–46.0)
Hemoglobin: 10.7 g/dL — ABNORMAL LOW (ref 12.0–15.0)
MCH: 24.8 pg — ABNORMAL LOW (ref 26.0–34.0)
MCHC: 31.1 g/dL (ref 30.0–36.0)
MCV: 79.6 fL — ABNORMAL LOW (ref 80.0–100.0)
Platelets: 254 10*3/uL (ref 150–400)
RBC: 4.32 MIL/uL (ref 3.87–5.11)
RDW: 17.9 % — ABNORMAL HIGH (ref 11.5–15.5)
WBC: 7.9 10*3/uL (ref 4.0–10.5)
nRBC: 0 % (ref 0.0–0.2)

## 2020-07-07 LAB — SALICYLATE LEVEL: Salicylate Lvl: <7 mg/dL — ABNORMAL LOW (ref 7.0–30.0)

## 2020-07-07 LAB — URINE DRUG SCREEN, QUALITATIVE (ARMC ONLY)
Amphetamines, Ur Screen: NOT DETECTED
Barbiturates, Ur Screen: NOT DETECTED
Benzodiazepine, Ur Scrn: NOT DETECTED
Cannabinoid 50 Ng, Ur ~~LOC~~: NOT DETECTED
Cocaine Metabolite,Ur ~~LOC~~: NOT DETECTED
MDMA (Ecstasy)Ur Screen: NOT DETECTED
Methadone Scn, Ur: NOT DETECTED
Opiate, Ur Screen: NOT DETECTED
Phencyclidine (PCP) Ur S: NOT DETECTED
Tricyclic, Ur Screen: POSITIVE — AB

## 2020-07-07 LAB — ETHANOL: Alcohol, Ethyl (B): <10 mg/dL (ref <10)

## 2020-07-07 LAB — POC URINE PREG, ED: Preg Test, Ur: NEGATIVE

## 2020-07-07 MED ORDER — QUETIAPINE FUMARATE 300 MG PO TABS: 300.0000 mg | ORAL_TABLET | Freq: Every day | ORAL | 1 refills | Status: DC

## 2020-07-07 NOTE — ED Triage Notes (Signed)
Pt c/o auditory and visual hallucinations, denies HI/SI. Pt is calm and cooperative

## 2020-07-07 NOTE — ED Notes (Signed)
E-signature not working at this time. Pt verbalized understanding of D/C instructions, prescriptions and follow up care with no further questions at this time. Pt in NAD and ambulatory at time of D/C.paper copy of discharge papers placed in patient chart at this time.

## 2020-07-07 NOTE — ED Provider Notes (Signed)
[facility] Emergency Department Provider Note  ____________________________________________   Event Date/Time   First MD Initiated Contact with Patient 07/07/20 1448     (approximate)  I have reviewed the triage vital signs and the nursing notes.   HISTORY  Chief Complaint Hallucinations   HPI Kayla Mcgee is a 40 y.o. female no past medical history of chronic low back pain, anxiety, asthma and bipolar disorder who presents for assessment of decreased sleep for the last 2 weeks associated with auditory and visual hallucinations.  Patient denies any SI or HI.  She states she is taking all her medicines as directed including Xanax and Vyvanse and Atarax she is running less of the Xanax because she does not want to be on this medication.  He states her medications are not helping she is exhausted.  She endorsed some chronic back pain but denies any other acute physical complaints including headache, earache sore throat, fevers, chills, cough, nausea, vomiting, diarrhea, dysuria, rash or recent traumatic injuries or falls.  She denies any SI or HI.  He states she is anxious hallucinating shadows and is hearing voices but is unable to specify exactly what they are saying.         Past Medical History:  Diagnosis Date  . Anxiety   . Asthma   . H. pylori infection   . Panic attacks   . Seasonal allergies     There are no problems to display for this patient.   Past Surgical History:  Procedure Laterality Date  . CESAREAN SECTION    . TONSILLECTOMY    . TUBAL LIGATION      Prior to Admission medications   Medication Sig Start Date End Date Taking? Authorizing Provider  esomeprazole (NEXIUM) 20 MG capsule Take 20 mg by mouth daily at 12 noon.    [provider]  gabapentin (NEURONTIN) 300 MG capsule Take 3 capsules (900 mg total) by mouth 3 (three) times daily for 15 days. 07/03/20 07/18/20  Menshew, Charlesetta Ivory, PA-C  ibuprofen  (ADVIL,MOTRIN) 600 MG tablet Take 1 tablet (600 mg total) by mouth every 6 (six) hours as needed. 05/12/18   Nita Sickle, MD  lidocaine (LIDODERM) 5 % Place 1 patch onto the skin every 12 (twelve) hours as needed for up to 10 days. Remove & Discard patch after 12 hours of wear each day. 07/03/20 07/13/20  Menshew, Charlesetta Ivory, PA-C  orphenadrine (NORFLEX) 100 MG tablet Take 1 tablet (100 mg total) by mouth 2 (two) times daily as needed for muscle spasms. 07/03/20   Menshew, Charlesetta Ivory, PA-C  oxybutynin (DITROPAN) 5 MG tablet Take 1 tablet (5 mg total) by mouth 3 (three) times daily. 05/16/18   Sharyn Creamer, MD    Allergies Sulfa antibiotics  No family history on file.  Social History Social History   Tobacco Use  . Smoking status: Current Every Day Smoker    Packs/day: 0.50    Types: Cigarettes  . Smokeless tobacco: Never Used  Vaping Use  . Vaping Use: Never used  Substance Use Topics  . Alcohol use: No  . Drug use: No    Review of Systems  Review of Systems  Constitutional: Positive for malaise/fatigue. Negative for chills and fever.  HENT: Negative for sore throat.   Eyes: Negative for pain.  Respiratory: Negative for cough and stridor.   Cardiovascular: Negative for chest pain.  Gastrointestinal: Negative for vomiting.  Genitourinary: Negative for dysuria.  Musculoskeletal: Positive  for back pain ( chronic).  Skin: Negative for rash.  Neurological: Negative for seizures, loss of consciousness and headaches.  Psychiatric/Behavioral: Positive for depression and hallucinations. Negative for suicidal ideas. The patient is nervous/anxious and has insomnia.   All other systems reviewed and are negative.     ____________________________________________   PHYSICAL EXAM:  VITAL SIGNS: ED Triage Vitals  Enc Vitals Group     BP 07/07/20 1428 128/83     Pulse Rate 07/07/20 1428 72     Resp 07/07/20 1428 18     Temp 07/07/20 1428 98.5 F (36.9 C)     Temp  Source 07/07/20 1428 Oral     SpO2 07/07/20 1428 98 %     Weight 07/07/20 1430 230 lb (104.3 kg)     Height 07/07/20 1430 5\' 5"  (1.651 m)     Head Circumference --      Peak Flow --      Pain Score 07/07/20 1430 7     Pain Loc --      Pain Edu? --      Excl. in GC? --    Vitals:   07/07/20 1428  BP: 128/83  Pulse: 72  Resp: 18  Temp: 98.5 F (36.9 C)  SpO2: 98%   Physical Exam Vitals and nursing note reviewed.  Constitutional:      General: She is not in acute distress.    Appearance: She is well-developed. She is obese.  HENT:     Head: Normocephalic and atraumatic.     Right Ear: External ear normal.     Left Ear: External ear normal.     Nose: Nose normal.  Eyes:     Conjunctiva/sclera: Conjunctivae normal.  Cardiovascular:     Rate and Rhythm: Normal rate and regular rhythm.     Heart sounds: No murmur heard.   Pulmonary:     Effort: Pulmonary effort is normal. No respiratory distress.     Breath sounds: Normal breath sounds.  Abdominal:     Palpations: Abdomen is soft.     Tenderness: There is no abdominal tenderness.  Musculoskeletal:     Cervical back: Neck supple.  Skin:    General: Skin is warm and dry.  Neurological:     Mental Status: She is alert and oriented to person, place, and time.  Psychiatric:        Mood and Affect: Mood is depressed. Mood is not anxious.        Speech: Speech is rapid and pressured and tangential.        Behavior: Behavior is hyperactive.        Thought Content: Thought content does not include homicidal or suicidal ideation.      ____________________________________________   LABS (all labs ordered are listed, but only abnormal results are displayed)  Labs Reviewed  CBC - Abnormal; Notable for the following components:      Result Value   Hemoglobin 10.7 (*)    HCT 34.4 (*)    MCV 79.6 (*)    MCH 24.8 (*)    RDW 17.9 (*)    All other components within normal limits  RESP PANEL BY RT-PCR (FLU A&B, COVID)  ARPGX2  COMPREHENSIVE METABOLIC PANEL  ETHANOL  SALICYLATE LEVEL  ACETAMINOPHEN LEVEL  URINE DRUG SCREEN, QUALITATIVE ([facility] ONLY)  POC URINE PREG, ED   ____________________________________________  EKG   ____________________________________________  RADIOLOGY  ED MD interpretation:    Official radiology report(s): No results found.  ____________________________________________  PROCEDURES  Procedure(s) performed (including Critical Care):  Procedures   ____________________________________________   INITIAL IMPRESSION / ASSESSMENT AND PLAN / ED COURSE      Patient presents with above-stated history exam for assessment of decreased sleep hallucinations anxiety and some depression.  She is not suicidal homicidal and while she appears manic does not appear acutely psychotic.  She is afebrile and hemodynamically stable.  She denies any recent injuries or other acute sick physical symptoms suspicion for organic etiology contributing to presentation today though we will send routine screening labs.  Will consult psychiatry and TTS.  Do believe patient requires medication management and stabilization of concern for decompensated mania.  The patient has been placed in psychiatric observation due to the need to provide a safe environment for the patient while obtaining psychiatric consultation and evaluation, as well as ongoing medical and medication management to treat the patient's condition.  The patient has not been placed under full IVC at this time.'       ____________________________________________   FINAL CLINICAL IMPRESSION(S) / ED DIAGNOSES  Final diagnoses:  Hallucinations  Insomnia, unspecified type    Medications - No data to display   ED Discharge Orders    None       Note:  This document was prepared using Dragon voice recognition software and may include unintentional dictation errors.   Gilles Chiquito, MD 07/07/20 1500

## 2020-07-07 NOTE — ED Provider Notes (Signed)
Patient seen by Dr. Toni Amend of psychiatry.  Patient will be discharged with plan for outpatient recheck follow-up and prescription for Seroquel.  Patient is amenable to this plan is inpatient hospitalization was offered but she prefers outpatient evaluation.  Discharged in stable condition.   Gilles Chiquito, MD 07/07/20 [phone]

## 2020-09-08 ENCOUNTER — Other Ambulatory Visit: Payer: Self-pay

## 2020-09-08 ENCOUNTER — Emergency Department
Admission: EM | Admit: 2020-09-08 | Discharge: 2020-09-09 | Disposition: A | Discharge status: Home / Self Care | Payer: No Typology Code available for payment source | Attending: Emergency Medicine | Admitting: Emergency Medicine

## 2020-09-08 DIAGNOSIS — F909 Attention-deficit hyperactivity disorder, unspecified type: Secondary | ICD-10-CM

## 2020-09-08 DIAGNOSIS — Z20822 Contact with and (suspected) exposure to covid-19: Secondary | ICD-10-CM | POA: Diagnosis not present

## 2020-09-08 DIAGNOSIS — F4310 Post-traumatic stress disorder, unspecified: Secondary | ICD-10-CM | POA: Insufficient documentation

## 2020-09-08 DIAGNOSIS — F3132 Bipolar disorder, current episode depressed, moderate: Secondary | ICD-10-CM | POA: Diagnosis not present

## 2020-09-08 DIAGNOSIS — F309 Manic episode, unspecified: Secondary | ICD-10-CM

## 2020-09-08 DIAGNOSIS — F319 Bipolar disorder, unspecified: Secondary | ICD-10-CM | POA: Diagnosis present

## 2020-09-08 DIAGNOSIS — F17210 Nicotine dependence, cigarettes, uncomplicated: Secondary | ICD-10-CM | POA: Diagnosis not present

## 2020-09-08 DIAGNOSIS — Z1339 Encounter for screening examination for other mental health and behavioral disorders: Secondary | ICD-10-CM | POA: Diagnosis not present

## 2020-09-08 DIAGNOSIS — J45909 Unspecified asthma, uncomplicated: Secondary | ICD-10-CM | POA: Insufficient documentation

## 2020-09-08 DIAGNOSIS — F1490 Cocaine use, unspecified, uncomplicated: Secondary | ICD-10-CM | POA: Insufficient documentation

## 2020-09-08 DIAGNOSIS — F1410 Cocaine abuse, uncomplicated: Secondary | ICD-10-CM

## 2020-09-08 LAB — URINALYSIS, COMPLETE (UACMP) WITH MICROSCOPIC
Bacteria, UA: NONE SEEN
Bilirubin Urine: NEGATIVE
Glucose, UA: NEGATIVE mg/dL
Hgb urine dipstick: NEGATIVE
Ketones, ur: NEGATIVE mg/dL
Leukocytes,Ua: NEGATIVE
Nitrite: NEGATIVE
Protein, ur: NEGATIVE mg/dL
Specific Gravity, Urine: 1.018 (ref 1.005–1.030)
pH: 5 (ref 5.0–8.0)

## 2020-09-08 LAB — URINE DRUG SCREEN, QUALITATIVE (ARMC ONLY)
Amphetamines, Ur Screen: POSITIVE — AB
Barbiturates, Ur Screen: NOT DETECTED
Benzodiazepine, Ur Scrn: NOT DETECTED
Cannabinoid 50 Ng, Ur ~~LOC~~: NOT DETECTED
Cocaine Metabolite,Ur ~~LOC~~: POSITIVE — AB
MDMA (Ecstasy)Ur Screen: NOT DETECTED
Methadone Scn, Ur: NOT DETECTED
Opiate, Ur Screen: NOT DETECTED
Phencyclidine (PCP) Ur S: NOT DETECTED
Tricyclic, Ur Screen: NOT DETECTED

## 2020-09-08 LAB — RESP PANEL BY RT-PCR (FLU A&B, COVID) ARPGX2
Influenza A by PCR: NEGATIVE
Influenza B by PCR: NEGATIVE
SARS Coronavirus 2 by RT PCR: NEGATIVE

## 2020-09-08 LAB — CBC
HCT: 35.1 % — ABNORMAL LOW (ref 36.0–46.0)
Hemoglobin: 11.2 g/dL — ABNORMAL LOW (ref 12.0–15.0)
MCH: 24 pg — ABNORMAL LOW (ref 26.0–34.0)
MCHC: 31.9 g/dL (ref 30.0–36.0)
MCV: 75.2 fL — ABNORMAL LOW (ref 80.0–100.0)
Platelets: 257 10*3/uL (ref 150–400)
RBC: 4.67 MIL/uL (ref 3.87–5.11)
RDW: 16 % — ABNORMAL HIGH (ref 11.5–15.5)
WBC: 11 10*3/uL — ABNORMAL HIGH (ref 4.0–10.5)
nRBC: 0 % (ref 0.0–0.2)

## 2020-09-08 LAB — COMPREHENSIVE METABOLIC PANEL
ALT: 18 U/L (ref 0–44)
AST: 20 U/L (ref 15–41)
Albumin: 4.1 g/dL (ref 3.5–5.0)
Alkaline Phosphatase: 82 U/L (ref 38–126)
Anion gap: 9 (ref 5–15)
BUN: 9 mg/dL (ref 6–20)
CO2: 21 mmol/L — ABNORMAL LOW (ref 22–32)
Calcium: 8.9 mg/dL (ref 8.9–10.3)
Chloride: 103 mmol/L (ref 98–111)
Creatinine, Ser: 0.64 mg/dL (ref 0.44–1.00)
GFR, Estimated: >60 mL/min (ref >60)
Glucose, Bld: 106 mg/dL — ABNORMAL HIGH (ref 70–99)
Potassium: 3.4 mmol/L — ABNORMAL LOW (ref 3.5–5.1)
Sodium: 133 mmol/L — ABNORMAL LOW (ref 135–145)
Total Bilirubin: 0.5 mg/dL (ref 0.3–1.2)
Total Protein: 7.7 g/dL (ref 6.5–8.1)

## 2020-09-08 LAB — POC URINE PREG, ED: Preg Test, Ur: NEGATIVE

## 2020-09-08 LAB — ETHANOL: Alcohol, Ethyl (B): <10 mg/dL (ref <10)

## 2020-09-08 MED ORDER — LORAZEPAM 1 MG PO TABS
1.0000 mg | ORAL_TABLET | Freq: Once | ORAL | Status: AC
  Administered 2020-09-08: 1 mg via ORAL
  Filled 2020-09-08: qty 1

## 2020-09-08 MED ORDER — QUETIAPINE FUMARATE 25 MG PO TABS
300.0000 mg | ORAL_TABLET | Freq: Every day | ORAL | Status: DC
  Administered 2020-09-08: 300 mg via ORAL
  Filled 2020-09-08: qty 1

## 2020-09-08 NOTE — ED Notes (Signed)
Pt. Transferred from Triage to room 5 after dressing out and screening for contraband. Report to include Situation, Background, Assessment and Recommendations from Raquel RN. Pt. Oriented to [place] including Q15 minute rounds as well as Psychologist, counselling for their protection. Patient is alert and oriented, warm and dry in no acute distress. Patient reported SI, and AVH. Denied HI. Pt. Encouraged to let me know if needs arise.

## 2020-09-08 NOTE — ED Notes (Signed)
Black pants Underwear Gray tshirt Glasses  Silver anklet Beige bra Silver necklace Black flip flops All locked in [employer] area

## 2020-09-08 NOTE — ED Provider Notes (Signed)
[facility] Emergency Department Provider Note   ____________________________________________   Event Date/Time   First MD Initiated Contact with Patient 09/08/20 2040     (approximate)  I have reviewed the triage vital signs and the nursing notes.   HISTORY  Chief Complaint Psychiatric Evaluation    HPI Kayla Mcgee is a 40 y.o. female with past medical history of asthma and anxiety who presents to the ED for psychiatric evaluation.  Patient states that she has had difficulty sleeping for the past 2 to 3 days, she is feeling increasingly jittery and like she is having a manic episode.  She states that she is attempting to get custody of her children and that she wants to get help before she uses drugs or does something else.  She does admit to cocaine use yesterday, denies any drug use today and denies any alcohol consumption.  She does state that when she gets like this she has thoughts of ending her life, but she denies any intention of acting on these.  She does report auditory hallucinations telling her that she is worthless.  She denies any medical complaints at this time.        Past Medical History:  Diagnosis Date   Anxiety    Asthma    H. pylori infection    Panic attacks    Seasonal allergies     There are no problems to display for this patient.   Past Surgical History:  Procedure Laterality Date   CESAREAN SECTION     TONSILLECTOMY     TUBAL LIGATION      Prior to Admission medications   Medication Sig Start Date End Date Taking? Authorizing Provider  esomeprazole (NEXIUM) 20 MG capsule Take 20 mg by mouth daily at 12 noon.    [provider]  gabapentin (NEURONTIN) 300 MG capsule Take 3 capsules (900 mg total) by mouth 3 (three) times daily for 15 days. 07/03/20 07/18/20  Menshew, Charlesetta Ivory, PA-C  ibuprofen (ADVIL,MOTRIN) 600 MG tablet Take 1 tablet (600 mg total) by mouth every 6 (six) hours as needed. 05/12/18    Nita Sickle, MD  orphenadrine (NORFLEX) 100 MG tablet Take 1 tablet (100 mg total) by mouth 2 (two) times daily as needed for muscle spasms. 07/03/20   Menshew, Charlesetta Ivory, PA-C  oxybutynin (DITROPAN) 5 MG tablet Take 1 tablet (5 mg total) by mouth 3 (three) times daily. 05/16/18   Sharyn Creamer, MD  QUEtiapine (SEROQUEL) 300 MG tablet Take 1 tablet (300 mg total) by mouth at bedtime. 07/07/20   Clapacs, Jackquline Denmark, MD    Allergies Sulfa antibiotics  No family history on file.  Social History Social History   Tobacco Use   Smoking status: Every Day    Packs/day: 0.50    Pack years: 0.00    Types: Cigarettes   Smokeless tobacco: Never  Vaping Use   Vaping Use: Never used  Substance Use Topics   Alcohol use: No   Drug use: No    Review of Systems  Constitutional: No fever/chills Eyes: No visual changes. ENT: No sore throat. Cardiovascular: Denies chest pain. Respiratory: Denies shortness of breath. Gastrointestinal: No abdominal pain.  No nausea, no vomiting.  No diarrhea.  No constipation. Genitourinary: Negative for dysuria. Musculoskeletal: Negative for back pain. Skin: Negative for rash. Neurological: Negative for headaches, focal weakness or numbness.  Positive for hallucinations.  ____________________________________________   PHYSICAL EXAM:  VITAL SIGNS: ED Triage Vitals  Enc Vitals Group     BP 09/08/20 2028 (!) 158/103     Pulse Rate 09/08/20 2028 (!) 115     Resp 09/08/20 2028 20     Temp 09/08/20 2028 98.7 F (37.1 C)     Temp Source 09/08/20 2028 Oral     SpO2 09/08/20 2028 98 %     Weight 09/08/20 2029 220 lb (99.8 kg)     Height 09/08/20 2029 5\' 5"  (1.651 m)     Head Circumference --      Peak Flow --      Pain Score 09/08/20 2028 0     Pain Loc --      Pain Edu? --      Excl. in GC? --     Constitutional: Alert and oriented. Eyes: Conjunctivae are normal. Head: Atraumatic. Nose: No congestion/rhinnorhea. Mouth/Throat: Mucous  membranes are moist. Neck: Normal ROM Cardiovascular: Normal rate, regular rhythm. Grossly normal heart sounds. Respiratory: Normal respiratory effort.  No retractions. Lungs CTAB. Gastrointestinal: Soft and nontender. No distention. Genitourinary: deferred Musculoskeletal: No lower extremity tenderness nor edema. Neurologic:  Normal speech and language. No gross focal neurologic deficits are appreciated. Skin:  Skin is warm, dry and intact. No rash noted. Psychiatric: Anxious appearing.  Speech and behavior are normal.  ____________________________________________   LABS (all labs ordered are listed, but only abnormal results are displayed)  Labs Reviewed  CBC - Abnormal; Notable for the following components:      Result Value   WBC 11.0 (*)    Hemoglobin 11.2 (*)    HCT 35.1 (*)    MCV 75.2 (*)    MCH 24.0 (*)    RDW 16.0 (*)    All other components within normal limits  RESP PANEL BY RT-PCR (FLU A&B, COVID) ARPGX2  COMPREHENSIVE METABOLIC PANEL  ETHANOL  URINALYSIS, COMPLETE (UACMP) WITH MICROSCOPIC  URINE DRUG SCREEN, QUALITATIVE ([facility] ONLY)  POC URINE PREG, ED     PROCEDURES  Procedure(s) performed (including Critical Care):  Procedures   ____________________________________________   INITIAL IMPRESSION / ASSESSMENT AND PLAN / ED COURSE      40 year old female with past medical history of asthma and anxiety who presents to the ED complaining of mania and auditory hallucinations.  She reports occasional thoughts of harming herself, but states she has no intention to act on these and we will maintain her voluntary status for now.  She denies any medical complaints and screening labs are unremarkable, she may be medically cleared for psychiatric evaluation.  The patient has been placed in psychiatric observation due to the need to provide a safe environment for the patient while obtaining psychiatric consultation and evaluation, as well as ongoing medical and  medication management to treat the patient's condition.  The patient has not been placed under full IVC at this time.       ____________________________________________   FINAL CLINICAL IMPRESSION(S) / ED DIAGNOSES  Final diagnoses:  Mania [facility])     ED Discharge Orders     None        Note:  This document was prepared using Dragon voice recognition software and may include unintentional dictation errors.    [facility], MD 09/08/20 2238
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
## 2020-09-08 NOTE — ED Triage Notes (Addendum)
Pt in with co feeling hyper manic. States take invega for the same but states has been feeling this way for 3 days. States has had some suicidal thoughts but states would not act on it. Pt also having visual and auditory hallucinations.

## 2020-09-09 DIAGNOSIS — F3132 Bipolar disorder, current episode depressed, moderate: Secondary | ICD-10-CM

## 2020-09-09 DIAGNOSIS — F1410 Cocaine abuse, uncomplicated: Secondary | ICD-10-CM

## 2020-09-09 DIAGNOSIS — F909 Attention-deficit hyperactivity disorder, unspecified type: Secondary | ICD-10-CM

## 2020-09-09 NOTE — ED Notes (Signed)
Hourly rounding reveals patient in room. No complaints, stable, in no acute distress. Q15 minute rounds and monitoring via [employer] Cam[employer] to continue. 

## 2020-09-09 NOTE — Consult Note (Signed)
[facility] Consult   Reason for Consult: Consult for this 40 year old woman who came to the emergency room complaining of feeling hyperactive Referring Physician: Paduchowski Patient Identification: Kayla Mcgee MRN:  [id number] Principal Diagnosis: Bipolar disorder with moderate depression ([place]) Diagnosis:  Principal Problem:   Bipolar disorder with moderate depression ([place]) Active Problems:   Cocaine abuse ([place])   ADHD   Total Time spent with patient: 1 hour  Subjective:   Kayla Mcgee is a 40 y.o. female patient admitted with "all of my disorders have not been addressed".  HPI: Patient seen chart reviewed.  40 year old woman came to the emergency room saying for the last 2 to 3 days she has been feeling hyperactive.  Also had reported visual and auditory hallucinations.  Feeling edgy like she is going too fast.  Not sleeping well.  Patient admits to having used cocaine recently.  Denies alcohol.  Denies methamphetamine but is currently prescribed pretty good doses of Vyvanse and Adderall.  Patient says that she feels that all of her mood problems are not being addressed.  She says she does not feel depressed and has no suicidal or homicidal thoughts but she feels like her "manic" symptoms are not being controlled.  She is currently just on stimulants Lunesta and gabapentin as well as an [place] shot regularly which is due again in a couple days.  Patient has been calm and cooperative in the emergency room with no sign of any dangerous behavior  Past Psychiatric History: History of treatment for mood symptoms and substance abuse.  She denies any history of inpatient treatment.  No history of suicidal or violent behavior in the past.  Only other medicine she can name that she remembers being prescribed in the past were Remeron and Strattera and Latuda  Risk to Self:   Risk to Others:   Prior Inpatient Therapy:   Prior Outpatient Therapy:    Past Medical History:   Past Medical History:  Diagnosis Date   Anxiety    Asthma    H. pylori infection    Panic attacks    Seasonal allergies     Past Surgical History:  Procedure Laterality Date   CESAREAN SECTION     TONSILLECTOMY     TUBAL LIGATION     Family History: No family history on file. Family Psychiatric  History: Several family members with bipolar disorder and schizophrenia Social History:  Social History   Substance and Sexual Activity  Alcohol Use No     Social History   Substance and Sexual Activity  Drug Use No    Social History   Socioeconomic History   Marital status: Single    Spouse name: Not on file   Number of children: Not on file   Years of education: Not on file   Highest education level: Not on file  Occupational History   Not on file  Tobacco Use   Smoking status: Every Day    Packs/day: 0.50    Pack years: 0.00    Types: Cigarettes   Smokeless tobacco: Never  Vaping Use   Vaping Use: Never used  Substance and Sexual Activity   Alcohol use: No   Drug use: No   Sexual activity: Yes    Birth control/protection: None  Other Topics Concern   Not on file  Social History Narrative   Not on file   Social Determinants of Health   Financial Resource Strain: Not on file  Food Insecurity: Not on  file  Transportation Needs: Not on file  Physical Activity: Not on file  Stress: Not on file  Social Connections: Not on file   Additional Social History:    Allergies:   Allergies  Allergen Reactions   Sulfa Antibiotics Hives and Nausea And Vomiting    Labs:  Results for orders placed or performed during the hospital encounter of 09/08/20 (from the past 48 hour(s))  CBC     Status: Abnormal   Collection Time: 09/08/20  8:37 PM  Result Value Ref Range   WBC 11.0 (H) 4.0 - 10.5 K/uL   RBC 4.67 3.87 - 5.11 MIL/uL   Hemoglobin 11.2 (L) 12.0 - 15.0 g/dL   HCT 35.1 (L) 36.0 - 46.0 %   MCV 75.2 (L) 80.0 - 100.0 fL   MCH 24.0 (L) 26.0 - 34.0 pg   MCHC  31.9 30.0 - 36.0 g/dL   RDW 16.0 (H) 11.5 - 15.5 %   Platelets 257 150 - 400 K/uL   nRBC 0.0 0.0 - 0.2 %    Comment: Performed at [facility], [address] [place], [place] [address]  Comprehensive metabolic panel     Status: Abnormal   Collection Time: 09/08/20  8:37 PM  Result Value Ref Range   Sodium 133 (L) 135 - 145 mmol/L   Potassium 3.4 (L) 3.5 - 5.1 mmol/L   Chloride 103 98 - 111 mmol/L   CO2 21 (L) 22 - 32 mmol/L   Glucose, Bld 106 (H) 70 - 99 mg/dL    Comment: Glucose reference range applies only to samples taken after fasting for at least 8 hours.   BUN 9 6 - 20 mg/dL   Creatinine, Ser 0.64 0.44 - 1.00 mg/dL   Calcium 8.9 8.9 - 10.3 mg/dL   Total Protein 7.7 6.5 - 8.1 g/dL   Albumin 4.1 3.5 - 5.0 g/dL   AST 20 15 - 41 U/L   ALT 18 0 - 44 U/L   Alkaline Phosphatase 82 38 - 126 U/L   Total Bilirubin 0.5 0.3 - 1.2 mg/dL   GFR, Estimated >60 >60 mL/min    Comment: (NOTE) Calculated using the CKD-EPI Creatinine Equation (2021)    Anion gap 9 5 - 15    Comment: Performed at [facility], [address], [place], [place] [address]  Ethanol     Status: None   Collection Time: 09/08/20  8:37 PM  Result Value Ref Range   Alcohol, Ethyl (B) <10 <10 mg/dL    Comment: (NOTE) Lowest detectable limit for serum alcohol is 10 mg/dL.  For medical purposes only. Performed at [facility], [address] [place], [place] [address]   Urinalysis, Complete w Microscopic     Status: Abnormal   Collection Time: 09/08/20  8:37 PM  Result Value Ref Range   Color, Urine YELLOW (A) YELLOW   APPearance HAZY (A) CLEAR   Specific Gravity, Urine 1.018 1.005 - 1.030   pH 5.0 5.0 - 8.0   Glucose, UA NEGATIVE NEGATIVE mg/dL   Hgb urine dipstick NEGATIVE NEGATIVE   Bilirubin Urine NEGATIVE NEGATIVE   Ketones, ur NEGATIVE NEGATIVE mg/dL   Protein, ur NEGATIVE NEGATIVE mg/dL   Nitrite NEGATIVE NEGATIVE   Leukocytes,Ua NEGATIVE NEGATIVE   RBC / HPF 0-5 0 -  5 RBC/hpf   WBC, UA 0-5 0 - 5 WBC/hpf   Bacteria, UA NONE SEEN NONE SEEN   Squamous Epithelial / LPF 6-10 0 - 5   Mucus  PRESENT     Comment: Performed at [facility], [address] [place], [place] [address]  Urine Drug Screen, Qualitative [facility] only)     Status: Abnormal   Collection Time: 09/08/20  8:37 PM  Result Value Ref Range   Tricyclic, Ur Screen NONE DETECTED NONE DETECTED   Amphetamines, Ur Screen POSITIVE (A) NONE DETECTED   MDMA (Ecstasy)Ur Screen NONE DETECTED NONE DETECTED   Cocaine Metabolite,Ur [place] POSITIVE (A) NONE DETECTED   Opiate, Ur Screen NONE DETECTED NONE DETECTED   Phencyclidine (PCP) Ur S NONE DETECTED NONE DETECTED   Cannabinoid 50 Ng, Ur [place] NONE DETECTED NONE DETECTED   Barbiturates, Ur Screen NONE DETECTED NONE DETECTED   Benzodiazepine, Ur Scrn NONE DETECTED NONE DETECTED   Methadone Scn, Ur NONE DETECTED NONE DETECTED    Comment: (NOTE) Tricyclics + metabolites, urine    Cutoff 1000 ng/mL Amphetamines + metabolites, urine  Cutoff 1000 ng/mL MDMA (Ecstasy), urine              Cutoff 500 ng/mL Cocaine Metabolite, urine          Cutoff 300 ng/mL Opiate + metabolites, urine        Cutoff 300 ng/mL Phencyclidine (PCP), urine         Cutoff 25 ng/mL Cannabinoid, urine                 Cutoff 50 ng/mL Barbiturates + metabolites, urine  Cutoff 200 ng/mL Benzodiazepine, urine              Cutoff 200 ng/mL Methadone, urine                   Cutoff 300 ng/mL  The urine drug screen provides only a preliminary, unconfirmed analytical test result and should not be used for non-medical purposes. Clinical consideration and professional judgment should be applied to any positive drug screen result due to possible interfering substances. A more specific alternate chemical method must be used in order to obtain a confirmed analytical result. Gas chromatography / mass spectrometry (GC/MS) is the preferred confirm atory method. Performed at [facility], [address] [place], [place] [address]   Resp Panel by RT-PCR (Flu A&B, Covid)     Status: None   Collection Time: 09/08/20  8:37 PM   Specimen: Nasopharyngeal(NP) swabs in vial transport medium  Result Value Ref Range   SARS Coronavirus 2 by RT PCR NEGATIVE NEGATIVE    Comment: (NOTE) SARS-CoV-2 target nucleic acids are NOT DETECTED.  The SARS-CoV-2 RNA is generally detectable in upper respiratory specimens during the acute phase of infection. The lowest concentration of SARS-CoV-2 viral copies this assay can detect is 138 copies/mL. A negative result does not preclude SARS-Cov-2 infection and should not be used as the sole basis for treatment or other patient management decisions. A negative result may occur with  improper specimen collection/handling, submission of specimen other than nasopharyngeal swab, presence of viral mutation(s) within the areas targeted by this assay, and inadequate number of viral copies(<138 copies/mL). A negative result must be combined with clinical observations, patient history, and epidemiological information. The expected result is Negative.  Fact Sheet for Patients:  EntrepreneurPulse.com.au  Fact Sheet for Healthcare Providers:  IncredibleEmployment.be  This test is no t yet approved or cleared by the [place] FDA and  has been authorized for detection and/or diagnosis of SARS-CoV-2 by FDA under an Emergency Use Authorization (EUA). This EUA will remain  in effect (meaning this  test can be used) for the duration of the COVID-19 declaration under Section 564(b)(1) of the Act, 21 U.S.C.section 360bbb-3(b)(1), unless the authorization is terminated  or revoked sooner.       Influenza A by PCR NEGATIVE NEGATIVE   Influenza B by PCR NEGATIVE NEGATIVE    Comment: (NOTE) The Xpert Xpress SARS-CoV-2/FLU/RSV plus assay is intended as an aid in the diagnosis of influenza from  Nasopharyngeal swab specimens and should not be used as a sole basis for treatment. Nasal washings and aspirates are unacceptable for Xpert Xpress SARS-CoV-2/FLU/RSV testing.  Fact Sheet for Patients: EntrepreneurPulse.com.au  Fact Sheet for Healthcare Providers: IncredibleEmployment.be  This test is not yet approved or cleared by the [place] FDA and has been authorized for detection and/or diagnosis of SARS-CoV-2 by FDA under an Emergency Use Authorization (EUA). This EUA will remain in effect (meaning this test can be used) for the duration of the COVID-19 declaration under Section 564(b)(1) of the Act, 21 U.S.C. section 360bbb-3(b)(1), unless the authorization is terminated or revoked.  Performed at [facility], [address] [place], [place] [address]   POC Urine Pregnancy, ED     Status: None   Collection Time: 09/08/20  8:40 PM  Result Value Ref Range   Preg Test, Ur NEGATIVE NEGATIVE    Comment:        THE SENSITIVITY OF THIS METHODOLOGY IS >24 mIU/mL     Current Facility-Administered Medications  Medication Dose Route Frequency Provider Last Rate Last Admin   QUEtiapine (SEROQUEL) tablet 300 mg  300 mg Oral QHS Blake Divine, MD   300 mg at 09/08/20 2303   Current Outpatient Medications  Medication Sig Dispense Refill   esomeprazole (NEXIUM) 20 MG capsule Take 20 mg by mouth daily at 12 noon.     gabapentin (NEURONTIN) 300 MG capsule Take 3 capsules (900 mg total) by mouth 3 (three) times daily for 15 days. 135 capsule 0   ibuprofen (ADVIL,MOTRIN) 600 MG tablet Take 1 tablet (600 mg total) by mouth every 6 (six) hours as needed. 20 tablet 0   orphenadrine (NORFLEX) 100 MG tablet Take 1 tablet (100 mg total) by mouth 2 (two) times daily as needed for muscle spasms. 20 tablet 0   oxybutynin (DITROPAN) 5 MG tablet Take 1 tablet (5 mg total) by mouth 3 (three) times daily. 15 tablet 0   QUEtiapine (SEROQUEL) 300 MG  tablet Take 1 tablet (300 mg total) by mouth at bedtime. 30 tablet 1    Musculoskeletal: Strength & Muscle Tone: within normal limits Gait & Station: normal Patient leans: N/A            Psychiatric Specialty Exam:  Presentation  General Appearance:  No data recorded Eye Contact: No data recorded Speech: No data recorded Speech Volume: No data recorded Handedness: No data recorded  Mood and Affect  Mood: No data recorded Affect: No data recorded  Thought Process  Thought Processes: No data recorded Descriptions of Associations:No data recorded Orientation:No data recorded Thought Content:No data recorded History of Schizophrenia/Schizoaffective disorder:No  Duration of Psychotic Symptoms:Less than six months  Hallucinations:No data recorded Ideas of Reference:No data recorded Suicidal Thoughts:No data recorded Homicidal Thoughts:No data recorded  Sensorium  Memory: No data recorded Judgment: No data recorded Insight: No data recorded  Executive Functions  Concentration: No data recorded Attention Span: No data recorded Recall: No data recorded Fund of Knowledge: No data recorded Language: No data recorded  Psychomotor Activity  Psychomotor Activity: No data  recorded  Assets  Assets: No data recorded  Sleep  Sleep: No data recorded  Physical Exam: Physical Exam Vitals and nursing note reviewed.  Constitutional:      Appearance: Normal appearance.  HENT:     Head: Normocephalic and atraumatic.     Mouth/Throat:     Pharynx: Oropharynx is clear.  Eyes:     Pupils: Pupils are equal, round, and reactive to light.  Cardiovascular:     Rate and Rhythm: Normal rate and regular rhythm.  Pulmonary:     Effort: Pulmonary effort is normal.     Breath sounds: Normal breath sounds.  Abdominal:     General: Abdomen is flat.     Palpations: Abdomen is soft.  Musculoskeletal:        General: Normal range of motion.  Skin:     General: Skin is warm and dry.  Neurological:     General: No focal deficit present.     Mental Status: She is alert. Mental status is at baseline.  Psychiatric:        Attention and Perception: Attention normal.        Mood and Affect: Mood is anxious.        Speech: Speech normal.        Behavior: Behavior is agitated. Behavior is not aggressive.        Thought Content: Thought content normal. Thought content does not include homicidal or suicidal ideation.        Cognition and Memory: Cognition normal.        Judgment: Judgment normal.   Review of Systems  Constitutional: Negative.   HENT: Negative.    Eyes: Negative.   Respiratory: Negative.    Cardiovascular: Negative.   Gastrointestinal: Negative.   Musculoskeletal: Negative.   Skin: Negative.   Neurological: Negative.   Psychiatric/Behavioral:  Positive for hallucinations and substance abuse. Negative for depression and suicidal ideas. The patient is nervous/anxious and has insomnia.   Blood pressure 115/75, pulse 77, temperature 98.6 F (37 C), temperature source Oral, resp. rate 18, height '5\' 5"'  (1.651 m), weight 99.8 kg, last menstrual period 09/01/2020, SpO2 97 %. Body mass index is 36.61 kg/m.  Treatment Plan Summary: Plan patient is currently calm and appropriate and insightful.  She is not reporting suicidal or homicidal thought.  She is having some auditory and visual hallucinations but they do not appear to distract her and she does not appear to otherwise be psychotic.  No criteria met for inpatient hospital treatment.  Spoke with her about her concerns about her medicine.  Encouraged her to get her [place]ne Bender shot but to address with her team that she feels that she is having manic symptoms and that perhaps she should be on a mood stabilizer.  Patient later decided that she would be following up with [facility] in the future.  Case reviewed with ER physician.  No new prescriptions written.  Does not need inpatient  treatment  Disposition: No evidence of imminent risk to self or others at present.   Patient does not meet criteria for psychiatric inpatient admission. Supportive therapy provided about ongoing stressors. Discussed crisis plan, support from social network, calling 911, coming to the Emergency Department, and calling Suicide Hotline.  Alethia Berthold, MD 09/09/2020 3:29 PM

## 2020-09-09 NOTE — ED Notes (Signed)
VS assess. Shower offered. No other needs found a this moment. Breakfast tray given.  

## 2020-09-09 NOTE — ED Notes (Signed)
Pt denies SI/HI/VH but endorses AH of hearing people calling her name. Pt reports she came here because she is depressed and also looking for help with substance abuse. Pt states she needs help with AH.

## 2020-09-09 NOTE — ED Notes (Signed)
VOL/Consult ordered/Moved to [employer]

## 2020-09-09 NOTE — ED Notes (Signed)
Pt discharged home. Discharge teaching done and pt verbalized understanding. Pt given all her personal belongings. Escorted to lobby, ambulatory and in NAD.

## 2020-09-09 NOTE — ED Notes (Signed)
VS not taken, Patient asleep. 

## 2020-09-09 NOTE — ED Provider Notes (Signed)
-----------------------------------------   12:17 PM on 09/09/2020 ----------------------------------------- Patient has been seen and evaluated by psychiatry.  They believe the patient is safe for discharge home from a psychiatric standpoint.  Patient's medical work-up has been largely nonrevealing besides amphetamine and cocaine positive urine toxicology.   Minna Antis, MD 09/09/20 1217

## 2020-09-09 NOTE — Consult Note (Signed)
Patient seen and case reviewed.  This is a brief note with full note to follow.  Patient does not meet commitment criteria and does not require inpatient psychiatric treatment.  Has outpatient treatment already in place.  Case reviewed with ER physician.

## 2020-09-09 NOTE — Discharge Instructions (Addendum)
You have been seen in the emergency department for a  psychiatric concern. You have been evaluated both medically as well as psychiatrically. Please follow-up with [facility]. Return to the emergency department for any worsening symptoms, or any thoughts of hurting yourself or anyone else so that we may attempt to help you.

## 2020-10-13 ENCOUNTER — Ambulatory Visit: Payer: Medicaid Other | Admitting: Pain Medicine

## 2020-11-11 DIAGNOSIS — M899 Disorder of bone, unspecified: Secondary | ICD-10-CM | POA: Insufficient documentation

## 2020-11-11 DIAGNOSIS — Z789 Other specified health status: Secondary | ICD-10-CM | POA: Insufficient documentation

## 2020-11-11 DIAGNOSIS — Z79899 Other long term (current) drug therapy: Secondary | ICD-10-CM | POA: Insufficient documentation

## 2020-11-11 DIAGNOSIS — G8929 Other chronic pain: Secondary | ICD-10-CM | POA: Insufficient documentation

## 2020-11-11 DIAGNOSIS — G894 Chronic pain syndrome: Secondary | ICD-10-CM | POA: Insufficient documentation

## 2020-11-11 NOTE — Progress Notes (Deleted)
No-show to initial evaluation on 11/12/2020.

## 2020-11-12 ENCOUNTER — Ambulatory Visit: Payer: Medicaid Other | Admitting: Pain Medicine

## 2020-11-12 DIAGNOSIS — G894 Chronic pain syndrome: Secondary | ICD-10-CM

## 2020-11-12 DIAGNOSIS — M899 Disorder of bone, unspecified: Secondary | ICD-10-CM

## 2020-11-12 DIAGNOSIS — Z789 Other specified health status: Secondary | ICD-10-CM

## 2020-11-12 DIAGNOSIS — Z79899 Other long term (current) drug therapy: Secondary | ICD-10-CM

## 2020-11-17 ENCOUNTER — Encounter: Payer: Self-pay | Admitting: Pain Medicine

## 2021-05-13 DIAGNOSIS — R69 Illness, unspecified: Secondary | ICD-10-CM | POA: Diagnosis not present

## 2021-05-22 DIAGNOSIS — M5416 Radiculopathy, lumbar region: Secondary | ICD-10-CM | POA: Diagnosis not present

## 2021-05-25 DIAGNOSIS — R21 Rash and other nonspecific skin eruption: Secondary | ICD-10-CM | POA: Diagnosis not present

## 2021-05-26 DIAGNOSIS — M5416 Radiculopathy, lumbar region: Secondary | ICD-10-CM | POA: Diagnosis not present

## 2021-06-29 DIAGNOSIS — M5412 Radiculopathy, cervical region: Secondary | ICD-10-CM | POA: Diagnosis not present

## 2021-06-29 DIAGNOSIS — M25512 Pain in left shoulder: Secondary | ICD-10-CM | POA: Diagnosis not present

## 2021-07-13 DIAGNOSIS — M25512 Pain in left shoulder: Secondary | ICD-10-CM | POA: Diagnosis not present

## 2021-07-13 DIAGNOSIS — M5412 Radiculopathy, cervical region: Secondary | ICD-10-CM | POA: Diagnosis not present

## 2022-08-11 ENCOUNTER — Emergency Department: Payer: Medicaid Other

## 2022-08-11 ENCOUNTER — Emergency Department
Admission: EM | Admit: 2022-08-11 | Discharge: 2022-08-11 | Disposition: A | Discharge status: Other Acute Inpatient Hospital | Payer: Medicaid Other | Attending: Emergency Medicine | Admitting: Emergency Medicine

## 2022-08-11 ENCOUNTER — Encounter: Payer: Self-pay | Admitting: Emergency Medicine

## 2022-08-11 DIAGNOSIS — Z20822 Contact with and (suspected) exposure to covid-19: Secondary | ICD-10-CM | POA: Diagnosis not present

## 2022-08-11 DIAGNOSIS — M542 Cervicalgia: Secondary | ICD-10-CM | POA: Diagnosis present

## 2022-08-11 DIAGNOSIS — D649 Anemia, unspecified: Secondary | ICD-10-CM | POA: Diagnosis not present

## 2022-08-11 DIAGNOSIS — J45909 Unspecified asthma, uncomplicated: Secondary | ICD-10-CM | POA: Insufficient documentation

## 2022-08-11 DIAGNOSIS — E876 Hypokalemia: Secondary | ICD-10-CM | POA: Insufficient documentation

## 2022-08-11 DIAGNOSIS — M4622 Osteomyelitis of vertebra, cervical region: Secondary | ICD-10-CM

## 2022-08-11 DIAGNOSIS — R519 Headache, unspecified: Secondary | ICD-10-CM | POA: Diagnosis not present

## 2022-08-11 DIAGNOSIS — M4632 Infection of intervertebral disc (pyogenic), cervical region: Secondary | ICD-10-CM | POA: Diagnosis not present

## 2022-08-11 LAB — SARS CORONAVIRUS 2 BY RT PCR: SARS Coronavirus 2 by RT PCR: NEGATIVE

## 2022-08-11 LAB — SEDIMENTATION RATE: Sed Rate: 85 mm/hr — ABNORMAL HIGH (ref 0–22)

## 2022-08-11 LAB — CBC WITH DIFFERENTIAL/PLATELET
Abs Immature Granulocytes: 0.02 10*3/uL (ref 0.00–0.07)
Basophils Absolute: 0 10*3/uL (ref 0.0–0.1)
Basophils Relative: 0 %
Eosinophils Absolute: 0.3 10*3/uL (ref 0.0–0.5)
Eosinophils Relative: 5 %
HCT: 26.1 % — ABNORMAL LOW (ref 36.0–46.0)
Hemoglobin: 7.9 g/dL — ABNORMAL LOW (ref 12.0–15.0)
Immature Granulocytes: 0 %
Lymphocytes Relative: 29 %
Lymphs Abs: 2 10*3/uL (ref 0.7–4.0)
MCH: 21 pg — ABNORMAL LOW (ref 26.0–34.0)
MCHC: 30.3 g/dL (ref 30.0–36.0)
MCV: 69.4 fL — ABNORMAL LOW (ref 80.0–100.0)
Monocytes Absolute: 0.9 10*3/uL (ref 0.1–1.0)
Monocytes Relative: 14 %
Neutro Abs: 3.5 10*3/uL (ref 1.7–7.7)
Neutrophils Relative %: 52 %
Platelets: 429 10*3/uL — ABNORMAL HIGH (ref 150–400)
RBC: 3.76 MIL/uL — ABNORMAL LOW (ref 3.87–5.11)
RDW: 18.3 % — ABNORMAL HIGH (ref 11.5–15.5)
WBC: 6.7 10*3/uL (ref 4.0–10.5)
nRBC: 0 % (ref 0.0–0.2)

## 2022-08-11 LAB — COMPREHENSIVE METABOLIC PANEL
ALT: 22 U/L (ref 0–44)
AST: 24 U/L (ref 15–41)
Albumin: 3.2 g/dL — ABNORMAL LOW (ref 3.5–5.0)
Alkaline Phosphatase: 80 U/L (ref 38–126)
Anion gap: 9 (ref 5–15)
BUN: 13 mg/dL (ref 6–20)
CO2: 19 mmol/L — ABNORMAL LOW (ref 22–32)
Calcium: 8.2 mg/dL — ABNORMAL LOW (ref 8.9–10.3)
Chloride: 111 mmol/L (ref 98–111)
Creatinine, Ser: 0.82 mg/dL (ref 0.44–1.00)
GFR, Estimated: >60 mL/min (ref >60)
Glucose, Bld: 107 mg/dL — ABNORMAL HIGH (ref 70–99)
Potassium: 2.9 mmol/L — ABNORMAL LOW (ref 3.5–5.1)
Sodium: 139 mmol/L (ref 135–145)
Total Bilirubin: 0.8 mg/dL (ref 0.3–1.2)
Total Protein: 7.4 g/dL (ref 6.5–8.1)

## 2022-08-11 LAB — C-REACTIVE PROTEIN: CRP: 16.3 mg/dL — ABNORMAL HIGH (ref <1.0)

## 2022-08-11 LAB — HCG, QUANTITATIVE, PREGNANCY: hCG, Beta Chain, Quant, S: 2 m[IU]/mL (ref <5)

## 2022-08-11 MED ORDER — GADOBUTROL 1 MMOL/ML IV SOLN
10.0000 mL | Freq: Once | INTRAVENOUS | Status: AC | PRN
  Administered 2022-08-11: 10 mL via INTRAVENOUS

## 2022-08-11 MED ORDER — POTASSIUM CHLORIDE CRYS ER 20 MEQ PO TBCR
40.0000 meq | EXTENDED_RELEASE_TABLET | Freq: Once | ORAL | Status: AC
  Administered 2022-08-11: 40 meq via ORAL
  Filled 2022-08-11: qty 2

## 2022-08-11 MED ORDER — MORPHINE SULFATE (PF) 4 MG/ML IV SOLN
4.0000 mg | Freq: Once | INTRAVENOUS | Status: AC
  Administered 2022-08-11: 4 mg via INTRAVENOUS
  Filled 2022-08-11: qty 1

## 2022-08-11 MED ORDER — VANCOMYCIN HCL 2000 MG/400ML IV SOLN
2000.0000 mg | Freq: Once | INTRAVENOUS | Status: AC
  Administered 2022-08-11: 2000 mg via INTRAVENOUS
  Filled 2022-08-11: qty 400

## 2022-08-11 MED ORDER — SODIUM CHLORIDE 0.9 % IV SOLN
2.0000 g | Freq: Once | INTRAVENOUS | Status: AC
  Administered 2022-08-11: 2 g via INTRAVENOUS
  Filled 2022-08-11: qty 20

## 2022-08-11 NOTE — ED Notes (Signed)
Report to les, [id number].  

## 2022-08-11 NOTE — ED Notes (Signed)
Pt top MRI.

## 2022-08-11 NOTE — ED Notes (Signed)
Called [facility] for possible NeuroSurgical transport

## 2022-08-11 NOTE — ED Notes (Signed)
Patient A/O. States neck pain still present but medication does help some. Helped position on left side. Vacomycin still infusing.

## 2022-08-11 NOTE — ED Notes (Signed)
Report given to [facility] RN Jill Alexanders.

## 2022-08-11 NOTE — ED Notes (Signed)
Pt would like to wait prior to applying shoulder immobilizer.

## 2022-08-11 NOTE — ED Notes (Signed)
Reached out to [facility] waiting on Nuerosurgical call back

## 2022-08-11 NOTE — ED Provider Notes (Signed)
-----------------------------------------   11:52 AM on 08/11/2022 ----------------------------------------- Patient care assumed from Dr. Sidney Ace.  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  Patient states increased pain over the past 2 weeks as well as weakness which she attributes to leading to her fall on Sunday.  Patient's lab work shows a normal CBC with a normal white blood cell count but does show an elevated CRP of 16 as well as an elevated ESR to 85.  Our neurosurgeon Dr. Marcell Barlow has evaluated and believe the patient needs a tertiary care center.  He is spoken to Dr. Okey Regal who is the patient's operating surgeon from July 2023 however he believes the patient needs a tertiary care center as well and does not believe he can handle the patient at his specialty Hospital.  I spoke with [facility] neurosurgery however after a prolonged wait they called and stated they are at capacity and cannot accept the patient.  I spoke to [facility] neurosurgery who is graciously accepted the patient to their service.  We will transfer once a bed is assigned to the patient.  I have seen and evaluated the patient overall she appears well but is complaining of neck pain and has required multiple rounds of pain medication.   Minna Antis, MD 08/11/22 1154

## 2022-08-11 NOTE — ED Notes (Signed)
XR at bedside

## 2022-08-11 NOTE — ED Triage Notes (Signed)
Pt presents ambulatory to triage via POV with complaints of neck pain, L arm, and L knee pain following a fall on Sunday. Pt notes slipping and falling down 6 steps landing on her L side. No LOC but did hit her face and head on the fall. Not on thinners. A&Ox4 at this time. Denies CP or SOB.

## 2022-08-11 NOTE — ED Notes (Signed)
Pt requesting pain meds. EDP Paduchowski notified via secure chat.

## 2022-08-11 NOTE — ED Provider Notes (Addendum)
[facility] Provider Note    Event Date/Time   First MD Initiated Contact with Patient 08/11/22 0411     (approximate)   History   Fall   HPI  Kayla Mcgee is a 42 y.o. female  with pmh bipolar disorder, cocaine use, PTSD, prior cervical fusion in July 2023 who presents because of pain after a fall.  Fall occurred on Saturday she was getting out of a trailer when she slipped and fell onto her face and hit her left arm.  She has had pain in her head and left arm since.  She also endorses ongoing neck pain since having surgery on her neck in July.  Says for the last 4 months her neck has been significantly painful and she does have numbness and weakness down the right arm.  No numbness or weakness in her lower extremities.     Past Medical History:  Diagnosis Date   Anxiety    Asthma    H. 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/24/2019   Epigastric pain 05/18/2017   Helicobacter pylori infection 05/18/2017   Iron deficiency anemia due to chronic blood loss 05/18/2017     Physical Exam  Triage Vital Signs: ED Triage Vitals  Enc Vitals Group     BP 08/11/22 0320 (!) 155/97     Pulse Rate 08/11/22 0320 73     Resp 08/11/22 0320 18     Temp 08/11/22 0320 97.7 F (36.5 C)     Temp Source 08/11/22 0320 Oral     SpO2 08/11/22 0320 98 %     Weight 08/11/22 0325 219 lb 2.2 oz (99.4 kg)     Height  08/11/22 0325 5\' 5"  (1.651 m)     Head Circumference --      Peak Flow --      Pain Score 08/11/22 0320 8     Pain Loc --      Pain Edu? --      Excl. in GC? --     Most recent vital signs: Vitals:   08/11/22 0320 08/11/22 0606  BP: (!) 155/97 124/69  Pulse: 73 73  Resp: 18 19  Temp: 97.7 F (36.5 C) (!) 97.5 F (36.4 C)  SpO2: 98% 97%     General: Awake, no distress.  CV:  Good peripheral perfusion.  Resp:  Normal effort.  Abd:  No distention.  Neuro:             Awake, Alert, Oriented x 3  Other:  + C spine ttp, decreased ROM of neck with rotation to the R 4/5 strength with grip on R 5/5 on the left 5/5 strength with elbow flexion, extension, wrist extension and finger abduction bilateral upper extremities  Swelling of the distal humerus, decreased range of motion with the elbow and pain with range of motion No focal bony tenderness of the left forearm wrist or hand, no tenderness of the  right upper extremity Chest wall nontender Abdomen is soft and nontender   ED Results / Procedures / Treatments  Labs (all labs ordered are listed, but only abnormal results are displayed) Labs Reviewed  COMPREHENSIVE METABOLIC PANEL - Abnormal; Notable for the following components:      Result Value   Potassium 2.9 (*)    CO2 19 (*)    Glucose, Bld 107 (*)    Calcium 8.2 (*)    Albumin 3.2 (*)    All other components within normal limits  CBC WITH DIFFERENTIAL/PLATELET - Abnormal; Notable for the following components:   RBC 3.76 (*)    Hemoglobin 7.9 (*)    HCT 26.1 (*)    MCV 69.4 (*)    MCH 21.0 (*)    RDW 18.3 (*)    Platelets 429 (*)    All other components within normal limits  SEDIMENTATION RATE - Abnormal; Notable for the following components:   Sed Rate 85 (*)    All other components within normal limits  CULTURE, BLOOD (ROUTINE X 2)  CULTURE, BLOOD (ROUTINE X 2)  HCG, QUANTITATIVE, PREGNANCY  C-REACTIVE PROTEIN  POC URINE PREG, ED  TYPE AND SCREEN      EKG     RADIOLOGY I reviewed and interpreted the CT scan of the brain which does not show any acute intracranial process    PROCEDURES:  Critical Care performed: No  .Critical Care  Performed by: Georga Hacking, MD Authorized by: Georga Hacking, MD   Critical care provider statement:    Critical care time (minutes):  30   Critical care was time spent personally by me on the following activities:  Development of treatment plan with patient or surrogate, discussions with consultants, evaluation of patient's response to treatment, examination of patient, ordering and review of laboratory studies, ordering and review of radiographic studies, ordering and performing treatments and interventions, pulse oximetry, re-evaluation of patient's condition and review of old charts     MEDICATIONS ORDERED IN ED: Medications  vancomycin (VANCOREADY) IVPB 2000 mg/400 mL (has no administration in time range)  cefTRIAXone (ROCEPHIN) 2 g in sodium chloride 0.9 % 100 mL IVPB (2 g Intravenous New Bag/Given 08/11/22 0631)  morphine (PF) 4 MG/ML injection 4 mg (4 mg Intravenous Given 08/11/22 0443)  gadobutrol (GADAVIST) 1 MMOL/ML injection 10 mL (10 mLs Intravenous Contrast Given 08/11/22 0449)  potassium chloride SA (KLOR-CON M) CR tablet 40 mEq (40 mEq Oral Given 08/11/22 0558)     IMPRESSION / MDM / ASSESSMENT AND PLAN / ED COURSE  I reviewed the triage vital signs and the nursing notes.                              Patient's presentation is most consistent with acute complicated illness / injury requiring diagnostic workup.  Differential diagnosis includes, but is not limited to, elbow fracture, contusion, muscle strain, chronic neck pain, hardware infection  The patient is a 42 year old female who presents because of pain after a fall.  She fell on Saturday this was mechanical in nature tripped on a step and then hit her head and left arm primarily.  She complains of left arm  pain head pain and neck pain.  Her neck pain has been an ongoing issue for her.  She had neck surgery she tells me at [facility] in July of last year although I am not able to see records of this.  She has had pain since surgery but worsening over the last 4 months.  Has radicular symptoms down the right arm which she describes as numbness and weakness.  No bowel or bladder symptoms no lower extremity symptoms.  She does endorse having subjective fevers but has not measured temp.  She is homeless.  Uses cocaine but no IV drugs.  Patient's vital signs are stable.  On exam she does have some midline C-spine tenderness and decreased range of motion.  Her grip in the right upper extremity is somewhat weaker than the left but the rest of her strength including elbow flexion extension, wrist extension, finger abduction are symmetric and sensation is intact.  A CT head C-spine and max face were ordered from triage.  Radiology did call me that her hardware looks to have significant edema around it with possible fluid collection concerning for hardware infection.  Will thus get MRI C-spine with and without contrast.  X-ray of the humerus was also ordered from triage which shows possible radial head fracture.  Patient is tender over the elbow and has some swelling so did obtain an elbow xray which shows a joint effusion but no obvious fracture. Will place in a sling as patient could have occult radial head fx. She will need f/u for this.   Ordered labs including blood culture CRP and ESR.  Patient's labs are concerning for anemia with hemoglobin of 7.9 with low MCV.  Does tell me that she has heavy menses last was yesterday.  Plans for 7 to 9 days.  Denies other GI bleeding.  Will order type and screen.  She is also hypokalemic with potassium of 2.9 which was supplemented orally.  Patient's MRI is concerning for infected hardware where with a prevertebral abscess and paraspinous abscess.  Discussed with Dr.  Marcell Barlow with neurosurgery who notes that patient will need to be transferred likely to [facility] given the extent of the infection.  He will discuss with patient's surgeon Sadie Haber.     FINAL CLINICAL IMPRESSION(S) / ED DIAGNOSES   Final diagnoses:  Infection of cervical spine (HCC)  Anemia, unspecified type  Hypokalemia     Rx / DC Orders   ED Discharge Orders     None        Note:  This document was prepared using Dragon voice recognition software and may include unintentional dictation errors.   Georga Hacking, MD 08/11/22 1610    Georga Hacking, MD 08/11/22 9604    Georga Hacking, MD 08/11/22 5409    Georga Hacking, MD 08/11/22 [phone]

## 2022-08-12 LAB — CULTURE, BLOOD (ROUTINE X 2)

## 2022-08-12 LAB — TYPE AND SCREEN
ABO/RH(D): B POS
Antibody Screen: NEGATIVE

## 2022-08-13 LAB — CULTURE, BLOOD (ROUTINE X 2)

## 2022-08-14 LAB — CULTURE, BLOOD (ROUTINE X 2): Culture: NO GROWTH

## 2022-08-16 LAB — CULTURE, BLOOD (ROUTINE X 2)
Culture: NO GROWTH
Special Requests: ADEQUATE

## 2022-09-14 ENCOUNTER — Encounter (HOSPITAL_COMMUNITY): Payer: Self-pay

## 2022-09-14 NOTE — Progress Notes (Signed)
This is a no charge note      Kayla Mcgee Female, 42 y.o., 20-Oct-1980 MRN: [id number]  Transfer back from [facility] per NP, Tollie Pizza (Dr. Celestia Khat neurosurgery team)  42 year old lady has past medical history of asthma, anxiety, anemia, panic attack, H. Pylori.  Patient was seen in our ED on 5/22 due to cervical spine abscess.  Dr. Marcell Barlow of neurosurgery was consulted, recommended to transfer patient to tertiary facility.  Patient was transferred to [facility], and had I&D on 5/24.  Tissue culture showed MRSA.  Patient was initially started on vancomycin, but developed allergic reaction, and treated with prednisone, then switched to daptomycin.  Patient has received 4 weeks of antibiotics, will need 2 more weeks of daptomycin IV.  Patient has anemia and received blood transfusion there.  Internal medicine was consulted. They thought pt's anemia was likely due to iron deficiency.   Lorretta Harp, MD  [facility]   If 7PM-7AM, please contact night-coverage [email or web address] 09/14/2022, 11:11 PM

## 2022-09-15 ENCOUNTER — Inpatient Hospital Stay
Admission: RE | Admit: 2022-09-15 | Discharge: 2022-09-24 | DRG: 095 | Disposition: A | Discharge status: Home / Self Care | Payer: Medicaid Other | Source: Ambulatory Visit | Attending: Internal Medicine | Admitting: Internal Medicine

## 2022-09-15 ENCOUNTER — Other Ambulatory Visit: Payer: Self-pay

## 2022-09-15 ENCOUNTER — Encounter: Payer: Self-pay | Admitting: Internal Medicine

## 2022-09-15 ENCOUNTER — Inpatient Hospital Stay: Payer: Medicaid Other

## 2022-09-15 DIAGNOSIS — G894 Chronic pain syndrome: Secondary | ICD-10-CM | POA: Diagnosis not present

## 2022-09-15 DIAGNOSIS — Z79899 Other long term (current) drug therapy: Secondary | ICD-10-CM | POA: Diagnosis not present

## 2022-09-15 DIAGNOSIS — F319 Bipolar disorder, unspecified: Secondary | ICD-10-CM | POA: Diagnosis present

## 2022-09-15 DIAGNOSIS — Z7984 Long term (current) use of oral hypoglycemic drugs: Secondary | ICD-10-CM | POA: Diagnosis not present

## 2022-09-15 DIAGNOSIS — Z882 Allergy status to sulfonamides status: Secondary | ICD-10-CM | POA: Diagnosis not present

## 2022-09-15 DIAGNOSIS — F3132 Bipolar disorder, current episode depressed, moderate: Secondary | ICD-10-CM | POA: Diagnosis not present

## 2022-09-15 DIAGNOSIS — D649 Anemia, unspecified: Secondary | ICD-10-CM | POA: Diagnosis not present

## 2022-09-15 DIAGNOSIS — Z7951 Long term (current) use of inhaled steroids: Secondary | ICD-10-CM

## 2022-09-15 DIAGNOSIS — D7212 Drug rash with eosinophilia and systemic symptoms syndrome: Secondary | ICD-10-CM | POA: Diagnosis not present

## 2022-09-15 DIAGNOSIS — T50905A Adverse effect of unspecified drugs, medicaments and biological substances, initial encounter: Secondary | ICD-10-CM

## 2022-09-15 DIAGNOSIS — Z881 Allergy status to other antibiotic agents status: Secondary | ICD-10-CM

## 2022-09-15 DIAGNOSIS — D509 Iron deficiency anemia, unspecified: Secondary | ICD-10-CM | POA: Diagnosis present

## 2022-09-15 DIAGNOSIS — T8149XA Infection following a procedure, other surgical site, initial encounter: Secondary | ICD-10-CM | POA: Diagnosis present

## 2022-09-15 DIAGNOSIS — F1410 Cocaine abuse, uncomplicated: Secondary | ICD-10-CM | POA: Diagnosis present

## 2022-09-15 DIAGNOSIS — F4310 Post-traumatic stress disorder, unspecified: Secondary | ICD-10-CM | POA: Diagnosis present

## 2022-09-15 DIAGNOSIS — E669 Obesity, unspecified: Secondary | ICD-10-CM | POA: Diagnosis present

## 2022-09-15 DIAGNOSIS — F410 Panic disorder [episodic paroxysmal anxiety] without agoraphobia: Secondary | ICD-10-CM | POA: Diagnosis present

## 2022-09-15 DIAGNOSIS — G8929 Other chronic pain: Secondary | ICD-10-CM | POA: Diagnosis present

## 2022-09-15 DIAGNOSIS — Z6834 Body mass index (BMI) 34.0-34.9, adult: Secondary | ICD-10-CM | POA: Diagnosis not present

## 2022-09-15 DIAGNOSIS — F17210 Nicotine dependence, cigarettes, uncomplicated: Secondary | ICD-10-CM | POA: Diagnosis present

## 2022-09-15 DIAGNOSIS — G2581 Restless legs syndrome: Secondary | ICD-10-CM | POA: Diagnosis present

## 2022-09-15 DIAGNOSIS — Z888 Allergy status to other drugs, medicaments and biological substances status: Secondary | ICD-10-CM

## 2022-09-15 DIAGNOSIS — J45909 Unspecified asthma, uncomplicated: Secondary | ICD-10-CM | POA: Diagnosis present

## 2022-09-15 DIAGNOSIS — F310 Bipolar disorder, current episode hypomanic: Secondary | ICD-10-CM | POA: Diagnosis present

## 2022-09-15 DIAGNOSIS — D508 Other iron deficiency anemias: Secondary | ICD-10-CM | POA: Diagnosis not present

## 2022-09-15 DIAGNOSIS — Y838 Other surgical procedures as the cause of abnormal reaction of the patient, or of later complication, without mention of misadventure at the time of the procedure: Secondary | ICD-10-CM | POA: Diagnosis present

## 2022-09-15 DIAGNOSIS — G061 Intraspinal abscess and granuloma: Secondary | ICD-10-CM | POA: Diagnosis not present

## 2022-09-15 DIAGNOSIS — E871 Hypo-osmolality and hyponatremia: Secondary | ICD-10-CM | POA: Diagnosis present

## 2022-09-15 DIAGNOSIS — F909 Attention-deficit hyperactivity disorder, unspecified type: Secondary | ICD-10-CM | POA: Diagnosis present

## 2022-09-15 LAB — MAGNESIUM: Magnesium: 2 mg/dL (ref 1.7–2.4)

## 2022-09-15 LAB — BASIC METABOLIC PANEL
Anion gap: 6 (ref 5–15)
BUN: 17 mg/dL (ref 6–20)
CO2: 29 mmol/L (ref 22–32)
Calcium: 8.3 mg/dL — ABNORMAL LOW (ref 8.9–10.3)
Chloride: 98 mmol/L (ref 98–111)
Creatinine, Ser: 0.56 mg/dL (ref 0.44–1.00)
GFR, Estimated: >60 mL/min (ref >60)
Glucose, Bld: 92 mg/dL (ref 70–99)
Potassium: 3.7 mmol/L (ref 3.5–5.1)
Sodium: 133 mmol/L — ABNORMAL LOW (ref 135–145)

## 2022-09-15 LAB — CBC
HCT: 23.5 % — ABNORMAL LOW (ref 36.0–46.0)
Hemoglobin: 7.6 g/dL — ABNORMAL LOW (ref 12.0–15.0)
MCH: 26.8 pg (ref 26.0–34.0)
MCHC: 32.3 g/dL (ref 30.0–36.0)
MCV: 82.7 fL (ref 80.0–100.0)
Platelets: 175 10*3/uL (ref 150–400)
RBC: 2.84 MIL/uL — ABNORMAL LOW (ref 3.87–5.11)
RDW: 27.2 % — ABNORMAL HIGH (ref 11.5–15.5)
WBC: 11.9 10*3/uL — ABNORMAL HIGH (ref 4.0–10.5)
nRBC: 0.2 % (ref 0.0–0.2)

## 2022-09-15 LAB — CREATININE, SERUM
Creatinine, Ser: 0.57 mg/dL (ref 0.44–1.00)
GFR, Estimated: >60 mL/min (ref >60)

## 2022-09-15 LAB — IRON AND TIBC
Iron: 35 ug/dL (ref 28–170)
Saturation Ratios: 11 % (ref 10.4–31.8)
TIBC: 326 ug/dL (ref 250–450)
UIBC: 291 ug/dL

## 2022-09-15 LAB — HIV ANTIBODY (ROUTINE TESTING W REFLEX): HIV Screen 4th Generation wRfx: NONREACTIVE

## 2022-09-15 LAB — VITAMIN B12: Vitamin B-12: 232 pg/mL (ref 180–914)

## 2022-09-15 LAB — FERRITIN: Ferritin: 25 ng/mL (ref 11–307)

## 2022-09-15 MED ORDER — TRIAMCINOLONE ACETONIDE 0.1 % EX CREA
TOPICAL_CREAM | Freq: Two times a day (BID) | CUTANEOUS | Status: DC
  Administered 2022-09-21: 1 via TOPICAL
  Filled 2022-09-15 (×2): qty 15

## 2022-09-15 MED ORDER — HYDROCORTISONE 2.5 % EX CREA: TOPICAL_CREAM | Freq: Two times a day (BID) | CUTANEOUS | Status: DC

## 2022-09-15 MED ORDER — HYDROCODONE-ACETAMINOPHEN 5-325 MG PO TABS
1.0000 | ORAL_TABLET | ORAL | Status: DC | PRN
  Administered 2022-09-15 – 2022-09-16 (×5): 1 via ORAL
  Administered 2022-09-16: 2 via ORAL
  Administered 2022-09-17 – 2022-09-21 (×10): 1 via ORAL
  Filled 2022-09-15 (×16): qty 1

## 2022-09-15 MED ORDER — HYDROXYZINE HCL 50 MG PO TABS
50.0000 mg | ORAL_TABLET | Freq: Four times a day (QID) | ORAL | Status: DC | PRN
  Administered 2022-09-15 – 2022-09-21 (×15): 50 mg via ORAL
  Filled 2022-09-15 (×15): qty 1

## 2022-09-15 MED ORDER — ONDANSETRON HCL 4 MG PO TABS: 4.0000 mg | ORAL_TABLET | Freq: Four times a day (QID) | ORAL | Status: DC | PRN

## 2022-09-15 MED ORDER — PREDNISONE 20 MG PO TABS
30.0000 mg | ORAL_TABLET | Freq: Every day | ORAL | Status: AC
  Administered 2022-09-21 – 2022-09-23 (×3): 30 mg via ORAL
  Filled 2022-09-15 (×3): qty 1

## 2022-09-15 MED ORDER — GABAPENTIN 300 MG PO CAPS
600.0000 mg | ORAL_CAPSULE | Freq: Every day | ORAL | Status: DC
  Administered 2022-09-15 – 2022-09-23 (×9): 600 mg via ORAL
  Filled 2022-09-15 (×9): qty 2

## 2022-09-15 MED ORDER — ACETAMINOPHEN 650 MG RE SUPP: 650.0000 mg | Freq: Four times a day (QID) | RECTAL | Status: DC | PRN

## 2022-09-15 MED ORDER — PREDNISONE 20 MG PO TABS
20.0000 mg | ORAL_TABLET | Freq: Every day | ORAL | Status: DC
  Administered 2022-09-24: 20 mg via ORAL
  Filled 2022-09-15: qty 1

## 2022-09-15 MED ORDER — PREDNISONE 20 MG PO TABS
40.0000 mg | ORAL_TABLET | Freq: Every day | ORAL | Status: AC
  Administered 2022-09-18 – 2022-09-20 (×3): 40 mg via ORAL
  Filled 2022-09-15 (×3): qty 2

## 2022-09-15 MED ORDER — TRAZODONE HCL 50 MG PO TABS
150.0000 mg | ORAL_TABLET | Freq: Every day | ORAL | Status: DC
  Administered 2022-09-15 – 2022-09-23 (×9): 150 mg via ORAL
  Filled 2022-09-15 (×9): qty 3

## 2022-09-15 MED ORDER — SODIUM CHLORIDE 0.9 % IV SOLN
700.0000 mg | Freq: Every day | INTRAVENOUS | Status: AC
  Administered 2022-09-15 – 2022-09-24 (×10): 700 mg via INTRAVENOUS
  Filled 2022-09-15 (×10): qty 14

## 2022-09-15 MED ORDER — GABAPENTIN 300 MG PO CAPS: 300.0000 mg | ORAL_CAPSULE | Freq: Two times a day (BID) | ORAL | Status: DC

## 2022-09-15 MED ORDER — DICLOFENAC SODIUM 1 % EX GEL
2.0000 g | Freq: Four times a day (QID) | CUTANEOUS | Status: DC
  Administered 2022-09-20: 2 g via TOPICAL
  Filled 2022-09-15: qty 100

## 2022-09-15 MED ORDER — POLYETHYLENE GLYCOL 3350 17 G PO PACK
17.0000 g | PACK | Freq: Two times a day (BID) | ORAL | Status: DC
  Administered 2022-09-15 – 2022-09-24 (×19): 17 g via ORAL
  Filled 2022-09-15 (×19): qty 1

## 2022-09-15 MED ORDER — FLUTICASONE PROPIONATE 50 MCG/ACT NA SUSP
2.0000 | Freq: Every day | NASAL | Status: DC
  Filled 2022-09-15: qty 16

## 2022-09-15 MED ORDER — FERROUS SULFATE 325 (65 FE) MG PO TABS
325.0000 mg | ORAL_TABLET | ORAL | Status: DC
  Administered 2022-09-15 – 2022-09-23 (×5): 325 mg via ORAL
  Filled 2022-09-15 (×7): qty 1

## 2022-09-15 MED ORDER — PREDNISONE 10 MG PO TABS: 10.0000 mg | ORAL_TABLET | Freq: Every day | ORAL | Status: DC

## 2022-09-15 MED ORDER — PSEUDOEPHEDRINE HCL 30 MG PO TABS
30.0000 mg | ORAL_TABLET | ORAL | Status: DC | PRN
  Administered 2022-09-15 – 2022-09-20 (×8): 30 mg via ORAL
  Filled 2022-09-15 (×10): qty 1

## 2022-09-15 MED ORDER — CHLORHEXIDINE GLUCONATE CLOTH 2 % EX PADS
6.0000 | MEDICATED_PAD | Freq: Every day | CUTANEOUS | Status: DC
  Administered 2022-09-15 – 2022-09-24 (×10): 6 via TOPICAL

## 2022-09-15 MED ORDER — ENOXAPARIN SODIUM 40 MG/0.4ML IJ SOSY
40.0000 mg | PREFILLED_SYRINGE | INTRAMUSCULAR | Status: DC
  Administered 2022-09-15 – 2022-09-19 (×5): 40 mg via SUBCUTANEOUS
  Filled 2022-09-15 (×5): qty 0.4

## 2022-09-15 MED ORDER — GABAPENTIN 300 MG PO CAPS
300.0000 mg | ORAL_CAPSULE | Freq: Two times a day (BID) | ORAL | Status: DC
  Administered 2022-09-15: 300 mg via ORAL
  Filled 2022-09-15: qty 1

## 2022-09-15 MED ORDER — SODIUM CHLORIDE 0.9% FLUSH
10.0000 mL | Freq: Two times a day (BID) | INTRAVENOUS | Status: DC
  Administered 2022-09-15 – 2022-09-23 (×17): 10 mL

## 2022-09-15 MED ORDER — GABAPENTIN 100 MG PO CAPS
200.0000 mg | ORAL_CAPSULE | Freq: Every day | ORAL | Status: DC
  Administered 2022-09-16 – 2022-09-24 (×9): 200 mg via ORAL
  Filled 2022-09-15 (×9): qty 2

## 2022-09-15 MED ORDER — ONDANSETRON HCL 4 MG/2ML IJ SOLN: 4.0000 mg | Freq: Four times a day (QID) | INTRAMUSCULAR | Status: DC | PRN

## 2022-09-15 MED ORDER — SODIUM CHLORIDE 0.9% FLUSH: 10.0000 mL | INTRAVENOUS | Status: DC | PRN

## 2022-09-15 MED ORDER — SENNA 8.6 MG PO TABS
2.0000 | ORAL_TABLET | Freq: Two times a day (BID) | ORAL | Status: DC
  Administered 2022-09-15 – 2022-09-24 (×19): 17.2 mg via ORAL
  Filled 2022-09-15 (×19): qty 2

## 2022-09-15 MED ORDER — LIDOCAINE 5 % EX PTCH
2.0000 | MEDICATED_PATCH | CUTANEOUS | Status: DC
  Administered 2022-09-17: 2 via TRANSDERMAL
  Filled 2022-09-15 (×4): qty 2

## 2022-09-15 MED ORDER — ACETAMINOPHEN 325 MG PO TABS: 650.0000 mg | ORAL_TABLET | Freq: Four times a day (QID) | ORAL | Status: DC | PRN

## 2022-09-15 MED ORDER — PANTOPRAZOLE SODIUM 40 MG PO TBEC
40.0000 mg | DELAYED_RELEASE_TABLET | Freq: Every day | ORAL | Status: DC
  Administered 2022-09-15 – 2022-09-24 (×10): 40 mg via ORAL
  Filled 2022-09-15 (×10): qty 1

## 2022-09-15 MED ORDER — LORATADINE 10 MG PO TABS
10.0000 mg | ORAL_TABLET | Freq: Every day | ORAL | Status: DC
  Administered 2022-09-15 – 2022-09-24 (×10): 10 mg via ORAL
  Filled 2022-09-15 (×10): qty 1

## 2022-09-15 MED ORDER — QUETIAPINE FUMARATE 300 MG PO TABS
500.0000 mg | ORAL_TABLET | Freq: Every day | ORAL | Status: DC
  Administered 2022-09-15 – 2022-09-23 (×9): 500 mg via ORAL
  Filled 2022-09-15 (×10): qty 1

## 2022-09-15 MED ORDER — TIZANIDINE HCL 2 MG PO TABS
2.0000 mg | ORAL_TABLET | Freq: Four times a day (QID) | ORAL | Status: DC | PRN
  Administered 2022-09-15 – 2022-09-24 (×13): 2 mg via ORAL
  Filled 2022-09-15 (×15): qty 1

## 2022-09-15 MED ORDER — PREDNISONE 50 MG PO TABS
50.0000 mg | ORAL_TABLET | Freq: Every day | ORAL | Status: AC
  Administered 2022-09-15 – 2022-09-17 (×3): 50 mg via ORAL
  Filled 2022-09-15 (×3): qty 1

## 2022-09-15 NOTE — Assessment & Plan Note (Addendum)
-   completed Oral prednisone 40 Mg daily started on 6/21 for 5 days (per DC summary from [facility]) - hydroxyzine 50 mg q6-8 hours -Triamcinolone 0.1% to substitute for hydrocortisone 2.5% cream twice daily(not on formulary) -Was evaluated by dermatology

## 2022-09-15 NOTE — Hospital Course (Addendum)
Kayla Mcgee is a 42 y.o. female with medical history significant of 42 year old lady who is here as a transfer back from [facility] .  Has past medical history of asthma, anxiety, anemia, panic attack, H. Pylori.  Patient was seen in our ED on 5/22 due to cervical spine abscess.  Dr. Marcell Barlow of neurosurgery was consulted, recommended to transfer patient to tertiary facility.  Patient was transferred to [facility], and had I&D on 5/24.  Tissue culture showed MRSA.  Patient was initially started on vancomycin, but developed allergic reaction, diagnosed as DRESS syndrome by dermatology on tapering steroids, then switched to daptomycin.  Patient has received 4 weeks of antibiotics, will need 2 more weeks of daptomycin IV.  Patient has anemia and received blood transfusion there.  7/2: Patient refusing diclofenac gel and lidocaine patches so changing it to as needed

## 2022-09-15 NOTE — Progress Notes (Signed)
Pharmacy Antibiotic Note  Kayla Mcgee is a 42 y.o. 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  Transfer back from [facility]. While at [facility], had been transitioned to daptomycin after vancomycin-induced DRESS. Pharmacy has been consulted for daptomycin dosing.  Last CK level from 09/09/2022 was within normal limits   Plan: Continue daptomycin 700 mg (~8 mg/kg) every 24 hours --Anticipated end date 09/24/22 (needs 2 additional weeks) --weekly CK level   Height: 5\' 5"  (165.1 cm) Weight: 94.1 kg (207 lb 7.3 oz) IBW/kg (Calculated) : 57  Temp (24hrs), Avg:97.7 F (36.5 C), Min:97.2 F (36.2 C), Max:98.3 F (36.8 C)  Recent Labs  Lab 09/15/22 0530  WBC 11.9*  CREATININE 0.56  0.57    Estimated Creatinine Clearance: 102.8 mL/min (by C-G formula based on SCr of 0.57 mg/dL).    Allergies  Allergen Reactions   Vancomycin Other (See Comments)    Fever   Sulfa Antibiotics Hives and Nausea And Vomiting    Antimicrobials this admission: daptomycin 6/15 >> (7/5)  Dose adjustments this admission: N/a  Microbiology results: Wound/tissue culture 5/28: MRSA  Thank you for allowing pharmacy to be a part of this patient's care.  Elliot Gurney, PharmD, BCPS Clinical Pharmacist  09/15/2022 2:32 PM

## 2022-09-15 NOTE — Assessment & Plan Note (Signed)
-   Continue ferrous sulfate 

## 2022-09-15 NOTE — Progress Notes (Signed)
  Chaplain On-Call responded to Spiritual Care Consult Order from Lorretta Harp, MD.  The request was to provide Advance Directives information to the patient.  Chaplain met the patient, who declined to receive the information.  Chaplain Morene Crocker., [facility]

## 2022-09-15 NOTE — Progress Notes (Signed)
  Progress Note   Patient: Kayla Mcgee ZOX:[id number] DOB: Mar 23, 1980 DOA: 09/15/2022     0 DOS: the patient was seen and examined on 09/15/2022   Brief hospital course: LAKEN ROG is a 42 y.o. female with medical history significant of 42 year old lady who is here as a transfer back from [facility] .  Has past medical history of asthma, anxiety, anemia, panic attack, H. Pylori.  Patient was seen in our ED on 5/22 due to cervical spine abscess.  Dr. Marcell Barlow of neurosurgery was consulted, recommended to transfer patient to tertiary facility.  Patient was transferred to [facility], and had I&D on 5/24.  Tissue culture showed MRSA.  Patient was initially started on vancomycin, but developed allergic reaction, diagnosed as DRESS syndrome by dermatology on tapering steroids, then switched to daptomycin.  Patient has received 4 weeks of antibiotics, will need 2 more weeks of daptomycin IV.  Patient has anemia and received blood transfusion there.   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  Bipolar disorder with moderate depression (HCC) Continue quetiapine 100 mg, 5 tabs nightly Trazodone 150 nightly  Chronic pain Continue gabapentin 300 mg twice daily Oxycodone 5 mg every 4 hours as needed Voltaren gel Zanaflex 2 mg every 6 as needed Lidocaine patch 4% 2 patches daily  IDA (iron deficiency anemia) Continue ferrous sulfate Recheck a CBC  tomorrow.       Subjective: Patient doing well, no complaints today.  Physical Exam: Vitals:   09/15/22 0329 09/15/22 0446 09/15/22 0808  BP: (!) 144/92 138/86 (!) 143/97  Pulse: 64 66 63  Resp: 20 20 17   Temp: 98.3 F (36.8 C) 97.7 F (36.5 C) (!) 97.2 F (36.2 C)  TempSrc: Oral Oral   SpO2: 96% 98% 100%  Weight: 94.1 kg    Height: 5\' 5"  (1.651 m)     General exam: Appears calm and comfortable, Obese Respiratory system: Clear to auscultation. Respiratory effort normal. Cardiovascular system: S1 & S2 heard, RRR. No JVD, murmurs, rubs, gallops or clicks. No pedal edema. Gastrointestinal system: Abdomen is nondistended, soft and nontender. No organomegaly or masses felt. Normal bowel sounds heard. Central nervous system: Alert and oriented. No focal neurological deficits. Extremities: Symmetric 5 x 5 power. Skin: No rashes, lesions or ulcers Psychiatry: Judgement and insight appear normal. Mood & affect appropriate.    Data Reviewed:  Lab results reviewed.  Family Communication: None  Disposition: Status is: Inpatient Remains inpatient appropriate because: Severity of disease, IV treatment.     Time spent: No charge  minutes  Author: Marrion Coy, MD 09/15/2022 12:20 PM  For on call review [email or web address].

## 2022-09-15 NOTE — Assessment & Plan Note (Addendum)
Continue gabapentin 300 mg twice daily Oxycodone 5 mg every 4 hours as needed Voltaren gel Zanaflex 2 mg every 6 as needed Lidocaine patch 4% 2 patches daily

## 2022-09-15 NOTE — Assessment & Plan Note (Addendum)
Continue quetiapine 100 mg, 5 tabs nightly Trazodone 150 nightly

## 2022-09-15 NOTE — H&P (Signed)
History and Physical    Patient: Kayla Mcgee:[id number] DOB: [birth year] DOA: 09/15/2022 DOS: the patient was seen and examined on 09/15/2022 PCP: [facility]  Patient coming from:  [facility]  Chief Complaint: No chief complaint on file.  HPI: Kayla Mcgee is a 42 y.o. female with medical history significant of 42 year old lady who is here as a transfer back from [facility] .  Has past medical history of asthma, anxiety, anemia, panic attack, H. Pylori.  Patient was seen in our ED on 5/22 due to cervical spine abscess.  Dr. Marcell Barlow of neurosurgery was consulted, recommended to transfer patient to tertiary facility.  Patient was transferred to [facility], and had I&D on 5/24.  Tissue culture showed MRSA.  Patient was initially started on vancomycin, but developed allergic reaction, diagnosed as DRESS syndrome by dermatology on tapering steroids, then switched to daptomycin.  Patient has received 4 weeks of antibiotics, will need 2 more weeks of daptomycin IV.  Patient has anemia and received blood transfusion there.  Internal medicine was consulted. They thought pt's anemia was likely due to iron deficiency.  Patient arrived on the floor and denying complaints.  Vital stable   Past Medical History:  Diagnosis Date   Anxiety    Asthma    H. pylori infection    Panic attacks    Seasonal allergies    Past Surgical History:  Procedure Laterality Date   CESAREAN SECTION     TONSILLECTOMY     TUBAL LIGATION     Social History:  reports that she has been smoking cigarettes. She has been smoking an average of .5 packs per day. She has never used smokeless tobacco. She reports current drug use. Drug: Cocaine. She reports that she does not drink alcohol.  Allergies  Allergen Reactions   Vancomycin Other (See Comments)    Fever   Sulfa Antibiotics Hives and Nausea And Vomiting    No family history on file.  Prior to Admission medications   Medication Sig Start Date End Date  Taking? Authorizing Provider  albuterol (PROAIR HFA) 108 (90 Base) MCG/ACT inhaler ProAir HFA 90 mcg/actuation aerosol inhaler  INHALE 2 PUFFS BY MOUTH EVERY 4 TO 6 HOURS AS NEEDED FOR WHEEZING   Yes [provider]  amphetamine-dextroamphetamine (ADDERALL XR) 10 MG 24 hr capsule Adderall XR 10 mg capsule,extended release  TAKE 1 CAPSULE BY MOUTH EVERY MORNING UPON AWAKENING   Yes [provider]  amphetamine-dextroamphetamine (ADDERALL) 20 MG tablet dextroamphetamine-amphetamine 20 mg tablet  TAKE 1 TABLET BY MOUTH EVERY AFTERNOON   Yes [provider]  atomoxetine (STRATTERA) 40 MG capsule atomoxetine 40 mg capsule  TAKE 1 CAPSULE BY MOUTH EVERY MORNING   Yes [provider]  atomoxetine (STRATTERA) 80 MG capsule atomoxetine 80 mg capsule  TAKE 1 CAPSULE BY MOUTH EVERY MORNING   Yes [provider]  busPIRone (BUSPAR) 5 MG tablet buspirone 5 mg tablet  TAKE 1 TABLET BY MOUTH THREE TIMES DAILY FOR ANXIETY   Yes [provider]  Cariprazine HCl (VRAYLAR) 4.5 MG CAPS Vraylar 4.5 mg capsule  TAKE 1 CAPSULE BY MOUTH EACH DAY   Yes [provider]  cetirizine (ZYRTEC) 10 MG chewable tablet Chew by mouth.   Yes [provider]  diphenhydrAMINE (BENADRYL) 25 mg capsule Take by mouth.   Yes [provider]  DULoxetine (CYMBALTA) 20 MG capsule duloxetine 20 mg capsule,delayed release  TAKE 1 CAPSULE BY MOUTH TWICE DAILY   Yes [provider]  DULoxetine (CYMBALTA) 20 MG capsule Take 20 mg by mouth 2 (two) times daily. 08/04/20  Yes [provider]  DULoxetine HCl 40 MG CPEP duloxetine 40 mg capsule,delayed release  TAKE 1 CAPSULE BY MOUTH DAILY   Yes [provider]  DULoxetine HCl 40 MG CPEP Take 1 capsule by mouth daily. 08/11/20  Yes [provider]  esomeprazole (NEXIUM) 20 MG capsule Take 20 mg by mouth daily at 12 noon.   Yes [provider]  esomeprazole (NEXIUM) 40 MG  capsule esomeprazole magnesium 40 mg capsule,delayed release  TAKE 1 CAPSULE BY MOUTH EVERY DAY   Yes [provider]  esomeprazole (NEXIUM) 40 MG capsule Take 40 mg by mouth daily. 08/15/20  Yes [provider]  eszopiclone (LUNESTA) 2 MG TABS tablet eszopiclone 2 mg tablet  TAKE 1 TABLET BY MOUTH DAILY AT BEDTIME AS NEEDED   Yes [provider]  eszopiclone (LUNESTA) 2 MG TABS tablet Take 2 mg by mouth at bedtime as needed. 08/04/20  Yes [provider]  Eszopiclone 3 MG TABS Take 3 mg by mouth at bedtime. 07/31/20  Yes [provider]  fexofenadine (ALLEGRA) 180 MG tablet Take 180 mg by mouth daily. 07/31/20  Yes [provider]  fluconazole (DIFLUCAN) 150 MG tablet fluconazole 150 mg tablet  TAKE 1 TABLET BY MOUTH 1 TIME FOR YEAST INFECTION. MAY REPEAT IN 3 DAYS IF SYMPTOMS PERSIST   Yes [provider]  fluconazole (DIFLUCAN) 150 MG tablet Take by mouth. 09/04/20  Yes [provider]  fluticasone (FLONASE) 50 MCG/ACT nasal spray Place into the nose.   Yes [provider]  gabapentin (NEURONTIN) 100 MG capsule gabapentin 100 mg capsule  TAKE ONE CAPSULE BY MOUTH THREE TIMES DAILY WITH THREE 300 MG CAPSULES FOR A TOTAL OF 1000 MG THREE TIMES DAILY   Yes [provider]  gabapentin (NEURONTIN) 100 MG capsule Take 100 mg by mouth 3 (three) times daily. 09/04/20  Yes [provider]  gabapentin (NEURONTIN) 300 MG capsule Take by mouth.   Yes [provider]  gabapentin (NEURONTIN) 300 MG capsule gabapentin 300 mg capsule  TAKE 3 CAPSULES BY MOUTH THREE TIMES DAILY 06/19/18  Yes [provider]  HYDROcodone-acetaminophen (NORCO/VICODIN) 5-325 MG tablet hydrocodone 5 mg-acetaminophen 325 mg tablet  TAKE 1 TABLET BY MOUTH EVERY 8 HOURS FOR 10 DAYS   Yes [provider]  HYDROcodone-acetaminophen (NORCO/VICODIN) 5-325 MG tablet Take 1 tablet by mouth every 8 (eight) hours. 06/09/20   Yes [provider]  hydrOXYzine (ATARAX/VISTARIL) 10 MG tablet hydroxyzine HCl 10 mg tablet  TAKE 1 TABLET BY MOUTH THREE TIMES DAILY AS NEEDED FOR ANXIETY   Yes [provider]  hydrOXYzine (ATARAX/VISTARIL) 10 MG tablet Take 10 mg by mouth 3 (three) times daily as needed. 06/13/20  Yes [provider]  hydrOXYzine (ATARAX/VISTARIL) 50 MG tablet hydroxyzine HCl 50 mg tablet  TAKE 1 TO 2 TABLETS BY MOUTH TWICE DAILY AS NEEDED FOR SEVERE ANXIETY AND PANIC ATTACKS   Yes [provider]  ibuprofen (ADVIL) 600 MG tablet Take 1 tablet by mouth every 6 (six) hours as needed. 05/12/18  Yes [provider]  ibuprofen (ADVIL) 800 MG tablet Take 1 tablet by mouth every 6 (six) hours as needed. 05/23/18  Yes [provider]  ibuprofen (ADVIL,MOTRIN) 600 MG tablet Take 1 tablet (600 mg total) by mouth every 6 (six) hours as needed. 05/12/18  Yes Don Perking, [place], MD  ketorolac (TORADOL) 30 MG/ML  injection ketorolac 30 mg/mL (1 mL) injection solution  Lot: [id number]: 12/2020   Yes [provider]  lactulose (CHRONULAC) 10 GM/15ML solution lactulose 10 gram/15 mL oral solution  TAKE 15 ML BY MOUTH THREE TIMES DAILY AS NEEDED FOR CONSTIPATION   Yes [provider]  lactulose (CHRONULAC) 10 GM/15ML solution SMARTSIG:15 Milliliter(s) By Mouth 3 Times Daily PRN 07/17/20  Yes [provider]  lactulose, encephalopathy, (CHRONULAC) 10 GM/15ML SOLN SMARTSIG:15 Milliliter(s) By Mouth 3 Times Daily PRN 09/04/20  Yes [provider]  lisdexamfetamine (VYVANSE) 50 MG capsule Vyvanse 50 mg capsule  TAKE 1 CAPSULE BY MOUTH EVERY MORNING   Yes [provider]  lisdexamfetamine (VYVANSE) 70 MG capsule Vyvanse 70 mg capsule  TAKE 1 CAPSULE BY MOUTH EVERY MORNING   Yes [provider]  lithium carbonate 300 MG capsule Take by mouth. 06/26/18  Yes [provider]  LORazepam (ATIVAN) 0.5 MG tablet lorazepam 0.5 mg  tablet  TAKE 1 TABLET BY MOUTH UP TO THREE TIMES DAILY FOR SEVERE ANXIETY AND PANIC ATTACKS   Yes [provider]  methocarbamol (ROBAXIN) 500 MG tablet Take by mouth.   Yes [provider]  methocarbamol (ROBAXIN) 500 MG tablet TAKE 1 TO 2 TABLETS(500 TO 1000 MG) BY MOUTH TWICE DAILY AS NEEDED 07/13/18  Yes [provider]  methylPREDNISolone (MEDROL DOSEPAK) 4 MG TBPK tablet See admin instructions. follow package directions 06/08/20  Yes [provider]  mirtazapine (REMERON) 15 MG tablet mirtazapine 15 mg tablet   Yes [provider]  mirtazapine (REMERON) 15 MG tablet Take 15 mg by mouth at bedtime. 06/27/20  Yes [provider]  Nicotine 21-14-7 MG/24HR KIT Apply 1 patch topically daily. 07/31/20  Yes [provider]  NORTRIPTYLINE HCL PO Take by mouth.   Yes [provider]  orphenadrine (NORFLEX) 100 MG tablet Take 1 tablet (100 mg total) by mouth 2 (two) times daily as needed for muscle spasms. 07/03/20  Yes Menshew, Charlesetta Ivory, PA-C  oxybutynin (DITROPAN) 5 MG tablet Take 1 tablet (5 mg total) by mouth 3 (three) times daily. 05/16/18  Yes Sharyn Creamer, MD  oxyCODONE-acetaminophen (PERCOCET) 7.5-325 MG tablet oxycodone-acetaminophen 7.5 mg-325 mg tablet  TAKE 1/2 TABLET BY MOUTH EVERY 4 HOURS AS NEEDED FOR PAIN   Yes [provider]  oxyCODONE-acetaminophen (PERCOCET) 7.5-325 MG tablet SMARTSIG:0.5 Tablet(s) By Mouth Every 4 Hours PRN 07/09/20  Yes [provider]  paliperidone (INVEGA) 6 MG 24 hr tablet paliperidone ER 6 mg tablet,extended release 24 hr  TAKE 1 TABLET BY MOUTH DAILY AT NOON   Yes [provider]  paliperidone (INVEGA) 6 MG 24 hr tablet Take 6 mg by mouth daily. 07/21/20  Yes [provider]  PHENAZOPYRIDINE HCL PO Take by mouth.   Yes [provider]  PRAZOSIN HCL PO Take by mouth. 06/26/18  Yes [provider]  PROAIR HFA 108 (90 Base) MCG/ACT inhaler  SMARTSIG:2 Puff(s) By Mouth Every 4-6 Hours PRN 07/18/20  Yes [provider]  QUEtiapine (SEROQUEL) 300 MG tablet Take 1 tablet (300 mg total) by mouth at bedtime. 07/07/20  Yes Clapacs, Jackquline Denmark, MD  rOPINIRole (REQUIP) 2 MG tablet ropinirole 2 mg tablet  TAKE 3 TABLETS BY MOUTH AT BEDTIME FOR RESTLESS LEG SYNDROME. MAY TAKE 1 TABLET DURING THE DAY AS NEEDED   Yes [provider]  SYMBICORT 160-4.5 MCG/ACT inhaler SMARTSIG:2 Puff(s) By Mouth Twice Daily 08/30/20  Yes [provider]  traZODone (DESYREL) 50 MG tablet  Take by mouth.   Yes [provider]  VYVANSE 50 MG capsule Take 50 mg by mouth every morning. 08/11/20  Yes [provider]  VYVANSE 70 MG capsule Take 70 mg by mouth every morning. 08/15/20  Yes [provider]  amoxicillin (AMOXIL) 500 MG capsule amoxicillin 500 mg capsule  TAKE 1 CAPSULE BY MOUTH THREE TIMES DAILY UNTIL GONE. START THE DAY BEFORE SURGERY. DO NOT TAKE THE MORNING OF SURGERY    [provider]  amoxicillin (AMOXIL) 500 MG capsule TAKE 1 CAPSULE BY MOUTH THREE TIMES DAILY UNTIL GONE. START THE DAY BEFORE SURGERY. DO NOT TAKE THE MORNING OF SURGERY 06/26/20   [provider]  amoxicillin-clavulanate (AUGMENTIN) 875-125 MG tablet amoxicillin 875 mg-potassium clavulanate 125 mg tablet  TAKE 1 TABLET BY MOUTH TWICE DAILY    [provider]  amoxicillin-clavulanate (AUGMENTIN) 875-125 MG tablet Take 1 tablet by mouth 2 (two) times daily. 06/16/20   [provider]  amphetamine-dextroamphetamine (ADDERALL) 20 MG tablet Take 20 mg by mouth daily. 08/11/20   [provider]  amphetamine-dextroamphetamine (ADDERALL) 5 MG tablet dextroamphetamine-amphetamine 5 mg tablet  TAKE 1 TABLET BY MOUTH DAILY IN THE AFTERNOON    [provider]  amphetamine-dextroamphetamine (ADDERALL) 5 MG tablet Take 1 tablet by mouth daily. 08/04/20   [provider]  ARIPiprazole (ABILIFY) 10 MG  tablet Take by mouth. Patient not taking: Reported on 09/15/2022 03/06/20   [provider]  atomoxetine (STRATTERA) 40 MG capsule Take 40 mg by mouth every morning. 06/13/20   [provider]  atomoxetine (STRATTERA) 80 MG capsule Take 80 mg by mouth every morning. 06/27/20   [provider]  azithromycin (ZITHROMAX) 250 MG tablet azithromycin 250 mg tablet    [provider]  azithromycin (ZITHROMAX) 250 MG tablet Take 250 mg by mouth as directed. 07/17/20   [provider]  benzonatate (TESSALON) 100 MG capsule benzonatate 100 mg capsule  TAKE ONE CAPSULE BY MOUTH THREE TIMES DAILY AS NEEDED FOR COUGH    [provider]  benzonatate (TESSALON) 100 MG capsule Take 100 mg by mouth 3 (three) times daily as needed. 07/17/20   [provider]  budesonide-formoterol (SYMBICORT) 160-4.5 MCG/ACT inhaler Symbicort 160 mcg-4.5 mcg/actuation HFA aerosol inhaler  INHALE 2 PUFFS BY MOUTH TWICE DAILY FOR ASTHMA    [provider]  busPIRone (BUSPAR) 5 MG tablet Take 5 mg by mouth 3 (three) times daily. 06/13/20   [provider]  cefdinir (OMNICEF) 300 MG capsule cefdinir 300 mg capsule  TAKE 1 CAPSULE BY MOUTH TWICE DAILY    [provider]  cefdinir (OMNICEF) 300 MG capsule Take 300 mg by mouth 2 (two) times daily. 08/14/20   [provider]  chlorhexidine (PERIDEX) 0.12 % solution chlorhexidine gluconate 0.12 % mouthwash    [provider]  chlorhexidine (PERIDEX) 0.12 % solution  06/26/20   [provider]  cyclobenzaprine (FLEXERIL) 10 MG tablet Take by mouth. 06/19/18   [provider]  cyclobenzaprine (FLEXERIL) 5 MG tablet cyclobenzaprine 5 mg tablet  TAKE 1 TO 2 TABLETS BY MOUTH EVERY 8 HOURS AS NEEDED FOR MUSCLE PAIN    [provider]  cyclobenzaprine (FLEXERIL) 5 MG tablet Take 5-10 mg by mouth every 8 (eight) hours as needed. 07/17/20   [provider]  diclofenac  Sodium (VOLTAREN) 1 % GEL diclofenac 1 % topical gel  APPLY 2 GRAMS TOPICALLY TO THE AFFECTED AREA FOUR TIMES DAILY    [provider]  doxycycline (VIBRA-TABS) 100 MG tablet doxycycline hyclate 100 mg tablet    [provider]  doxycycline (VIBRA-TABS) 100 MG tablet Take 100 mg by mouth 2 (two) times daily. 09/04/20   [provider]  gabapentin (NEURONTIN) 300 MG capsule Take 3 capsules (900 mg total) by mouth 3 (three) times daily for 15 days. 07/03/20 07/18/20  Menshew, Charlesetta Ivory, PA-C  metFORMIN (GLUCOPHAGE) 500 MG tablet Take by mouth. 02/07/20 02/06/21  [provider]    Physical Exam: Vitals:   09/15/22 0329  BP: (!) 144/92  Pulse: 64  Resp: 20  Temp: 98.3 F (36.8 C)  TempSrc: Oral  SpO2: 96%  Weight: 94.1 kg  Height: 5\' 5"  (1.651 m)   Physical Exam Vitals and nursing note reviewed.  Constitutional:      General: She is not in acute distress. HENT:     Head: Normocephalic and atraumatic.  Cardiovascular:     Rate and Rhythm: Normal rate and regular rhythm.     Heart sounds: Normal heart sounds.  Pulmonary:     Effort: Pulmonary effort is normal.     Breath sounds: Normal breath sounds.  Abdominal:     Palpations: Abdomen is soft.     Tenderness: There is no abdominal tenderness.  Neurological:     Mental Status: Mental status is at baseline.     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 in July 2023   -Continue daptomycin started at 8mg /kg on 6/15, end date 7/5 via PICC  DRESS syndrome, from vancomycin allergy - completed Oral prednisone 40 Mg daily started on 6/21 for 5 days (per DC summary from [facility]) - hydroxyzine 50 mg q6-8 hours -Triamcinolone 0.1% to substitute for hydrocortisone 2.5% cream twice daily(not on formulary) -Was  evaluated by dermatology  Bipolar disorder with moderate depression (HCC) Continue quetiapine 100 mg, 5 tabs nightly Trazodone 150 nightly  Chronic pain Continue gabapentin 300 mg twice daily Oxycodone 5 mg every 4 hours as needed Voltaren gel Zanaflex 2 mg every 6 as needed Lidocaine patch 4% 2 patches daily  IDA (iron deficiency anemia) Continue ferrous sulfate      Advance Care Planning:   Code Status: Full Code   Consults:   Family Communication:   Severity of Illness: The appropriate patient status for this patient is INPATIENT. Inpatient status is judged to be reasonable and necessary in order to provide the required intensity of service to ensure the patient's safety. The patient's presenting symptoms, physical exam findings, and initial radiographic and laboratory data in the context of their chronic comorbidities is felt to place them at high risk for further clinical deterioration. Furthermore, it is not anticipated that the patient will be medically stable for discharge from the hospital within 2 midnights of admission.   * I certify that at the point of admission it is my clinical judgment that the patient will require inpatient hospital care spanning beyond 2 midnights from the point of admission due to high intensity of service, high risk for further deterioration and high frequency of surveillance required.*  Author: Andris Baumann, MD 09/15/2022 3:59 AM  For on call review [email or web address].

## 2022-09-15 NOTE — Assessment & Plan Note (Addendum)
Hx C4-6 ACDF in July 2023   -Continue daptomycin started at 8mg /kg on 6/15, end date 7/5 via PICC

## 2022-09-16 DIAGNOSIS — T50905A Adverse effect of unspecified drugs, medicaments and biological substances, initial encounter: Secondary | ICD-10-CM | POA: Diagnosis not present

## 2022-09-16 DIAGNOSIS — D649 Anemia, unspecified: Secondary | ICD-10-CM | POA: Insufficient documentation

## 2022-09-16 DIAGNOSIS — G061 Intraspinal abscess and granuloma: Secondary | ICD-10-CM | POA: Diagnosis not present

## 2022-09-16 DIAGNOSIS — D7212 Drug rash with eosinophilia and systemic symptoms syndrome: Secondary | ICD-10-CM

## 2022-09-16 LAB — CBC
HCT: 25.9 % — ABNORMAL LOW (ref 36.0–46.0)
Hemoglobin: 8.9 g/dL — ABNORMAL LOW (ref 12.0–15.0)
MCH: 28.7 pg (ref 26.0–34.0)
MCHC: 34.4 g/dL (ref 30.0–36.0)
MCV: 83.5 fL (ref 80.0–100.0)
Platelets: 221 10*3/uL (ref 150–400)
RBC: 3.1 MIL/uL — ABNORMAL LOW (ref 3.87–5.11)
RDW: 27.9 % — ABNORMAL HIGH (ref 11.5–15.5)
WBC: 9.8 10*3/uL (ref 4.0–10.5)
nRBC: 0 % (ref 0.0–0.2)

## 2022-09-16 LAB — CK: Total CK: 16 U/L — ABNORMAL LOW (ref 38–234)

## 2022-09-16 MED ORDER — CYANOCOBALAMIN 1000 MCG/ML IJ SOLN
1000.0000 ug | Freq: Once | INTRAMUSCULAR | Status: AC
  Administered 2022-09-16: 1000 ug via INTRAMUSCULAR
  Filled 2022-09-16: qty 1

## 2022-09-16 NOTE — Progress Notes (Signed)
  Progress Note   Patient: Kayla Mcgee XBM:[id number] DOB: [birth year] DOA: 09/15/2022     1 DOS: the patient was seen and examined on 09/16/2022   Brief hospital course: JESSALYNN MCCOWAN is a 42 y.o. female with medical history significant of 42 year old lady who is here as a transfer back from [facility] .  Has past medical history of asthma, anxiety, anemia, panic attack, H. Pylori.  Patient was seen in our ED on 5/22 due to cervical spine abscess.  Dr. Marcell Barlow of neurosurgery was consulted, recommended to transfer patient to tertiary facility.  Patient was transferred to [facility], and had I&D on 5/24.  Tissue culture showed MRSA.  Patient was initially started on vancomycin, but developed allergic reaction, diagnosed as DRESS syndrome by dermatology on tapering steroids, then switched to daptomycin.  Patient has received 4 weeks of antibiotics, will need 2 more weeks of daptomycin IV.  Patient has anemia and received blood transfusion there.   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   DRESS syndrome, from vancomycin allergy Reviewed medical record from [facility], continue to complete course of steroids per recommendation from dermatology.   Bipolar disorder with moderate depression (HCC) Continue quetiapine 100 mg, 5 tabs nightly Trazodone 150 nightly   Chronic pain Continue gabapentin 300 mg twice daily Oxycodone 5 mg every 4 hours as needed Voltaren gel Zanaflex 2 mg every 6 as needed Lidocaine  patch 4% 2 patches daily   IDA (iron deficiency anemia) Acute on chronic anemia. Continue ferrous sulfate B12 level 232, check homocystine level, give her dose of B12 injection.      Subjective:  Patient doing well, no complaint today.  Physical Exam: Vitals:   09/15/22 2051 09/15/22 2225 09/16/22 0505 09/16/22 0849  BP: 128/83 139/76 124/71 (!) 135/94  Pulse: 63 67 65 67  Resp: 16  16 18   Temp: 97.9 F (36.6 C)  98 F (36.7 C) 97.8 F (36.6 C)  TempSrc:   Oral Oral  SpO2: 98% 96% 97% 98%  Weight:      Height:       General exam: Appears calm and comfortable  Respiratory system: Clear to auscultation. Respiratory effort normal. Cardiovascular system: S1 & S2 heard, RRR. No JVD, murmurs, rubs, gallops or clicks. No pedal edema. Gastrointestinal system: Abdomen is nondistended, soft and nontender. No organomegaly or masses felt. Normal bowel sounds heard. Central nervous system: Alert and oriented. No focal neurological deficits. Extremities: Symmetric 5 x 5 power. Skin: No rashes, lesions or ulcers Psychiatry: Judgement and insight appear normal. Mood & affect appropriate.    Data Reviewed:  Lab results reviewed.  Family Communication: Fianc updated at bedside.  Disposition: Status is: Inpatient Remains inpatient appropriate because: Severity of disease, IV treatment.     Time spent: 35 minutes  Author: Marrion Coy, MD 09/16/2022 10:39 AM  For on call review [email or web address].

## 2022-09-17 DIAGNOSIS — D508 Other iron deficiency anemias: Secondary | ICD-10-CM

## 2022-09-17 DIAGNOSIS — G061 Intraspinal abscess and granuloma: Secondary | ICD-10-CM | POA: Diagnosis not present

## 2022-09-17 DIAGNOSIS — T50905A Adverse effect of unspecified drugs, medicaments and biological substances, initial encounter: Secondary | ICD-10-CM | POA: Diagnosis not present

## 2022-09-17 DIAGNOSIS — D7212 Drug rash with eosinophilia and systemic symptoms syndrome: Secondary | ICD-10-CM | POA: Diagnosis not present

## 2022-09-17 LAB — HOMOCYSTEINE: Homocysteine: 5.5 umol/L (ref 0.0–14.5)

## 2022-09-17 MED ORDER — VITAMIN B-12 1000 MCG PO TABS
1000.0000 ug | ORAL_TABLET | Freq: Every day | ORAL | Status: DC
  Administered 2022-09-17 – 2022-09-24 (×8): 1000 ug via ORAL
  Filled 2022-09-17 (×8): qty 1

## 2022-09-17 NOTE — Progress Notes (Signed)
Progress Note   Patient: Kayla Mcgee:[id number] DOB: [birth year] DOA: 09/15/2022     2 DOS: the patient was seen and examined on 09/17/2022   Brief hospital course: Kayla Mcgee is a 42 y.o. female with medical history significant of 42 year old lady who is here as a transfer back from [facility] .  Has past medical history of asthma, anxiety, anemia, panic attack, H. Pylori.  Patient was seen in our ED on 5/22 due to cervical spine abscess.  Dr. Marcell Barlow of neurosurgery was consulted, recommended to transfer patient to tertiary facility.  Patient was transferred to [facility], and had I&D on 5/24.  Tissue culture showed MRSA.  Patient was initially started on vancomycin, but developed allergic reaction, diagnosed as DRESS syndrome by dermatology on tapering steroids, then switched to daptomycin.  Patient has received 4 weeks of antibiotics, will need 2 more weeks of daptomycin IV.  Patient has anemia and received blood transfusion there.   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 Patient blood pressure was low this morning, but the patient was asymptomatic.  Repeat blood pressure was better.  Patient is not on any blood pressure medicine.  No evidence of sepsis.   DRESS syndrome, from vancomycin allergy Reviewed medical record from [facility], continue to complete course of steroids per recommendation from dermatology.   Bipolar disorder  with moderate depression (HCC) Continue quetiapine 100 mg, 5 tabs nightly Trazodone 150 nightly   Chronic pain Continue gabapentin 300 mg twice daily Oxycodone 5 mg every 4 hours as needed Voltaren gel Zanaflex 2 mg every 6 as needed Lidocaine patch 4% 2 patches daily   IDA (iron deficiency anemia) Acute on chronic anemia. Continue ferrous sulfate B12 level 232, check homocystine level pending, received 1 dose of injected B12, I restarted oral B12 until homocystine level is available.  Hemoglobin is better.      Subjective:  Patient doing well, no complaint.  Physical Exam: Vitals:   09/16/22 2027 09/17/22 0518 09/17/22 0756 09/17/22 0829  BP: 118/83 (!) 81/46 (!) 87/61 97/64  Pulse: 80 (!) 59 66   Resp: 16 18 18    Temp: 97.8 F (36.6 C) 98.6 F (37 C) 97.6 F (36.4 C)   TempSrc:  Oral    SpO2: 98% 97% 98%   Weight:      Height:       General exam: Appears calm and comfortable  Respiratory system: Clear to auscultation. Respiratory effort normal. Cardiovascular system: S1 & S2 heard, RRR. No JVD, murmurs, rubs, gallops or clicks. No pedal edema. Gastrointestinal system: Abdomen is nondistended, soft and nontender. No organomegaly or masses felt. Normal bowel sounds heard. Central nervous system: Alert and oriented. No focal neurological deficits. Extremities: Symmetric 5 x 5 power. Skin: No rashes, lesions or ulcers Psychiatry: Judgement and insight appear normal. Mood & affect appropriate.    Data Reviewed:  Lab results reviewed.  Family Communication: Boyfriend at bedside.  Disposition: Status is: Inpatient Remains inpatient appropriate because: Severity of disease, IV treatment.     Time spent: 35 minutes  Author: Marrion Coy, MD 09/17/2022 11:39 AM  For on call review [email or web address].

## 2022-09-18 DIAGNOSIS — F3132 Bipolar disorder, current episode depressed, moderate: Secondary | ICD-10-CM

## 2022-09-18 DIAGNOSIS — G061 Intraspinal abscess and granuloma: Secondary | ICD-10-CM | POA: Diagnosis not present

## 2022-09-18 DIAGNOSIS — T50905A Adverse effect of unspecified drugs, medicaments and biological substances, initial encounter: Secondary | ICD-10-CM | POA: Diagnosis not present

## 2022-09-18 DIAGNOSIS — D7212 Drug rash with eosinophilia and systemic symptoms syndrome: Secondary | ICD-10-CM | POA: Diagnosis not present

## 2022-09-18 NOTE — Progress Notes (Signed)
Progress Note   Patient: Kayla Mcgee NWG:[id number] DOB: 04/14/1980 DOA: 09/15/2022     3 DOS: the patient was seen and examined on 09/18/2022   Brief hospital course: GEET EHMAN is a 42 y.o. female with medical history significant of 42 year old lady who is here as a transfer back from [facility] .  Has past medical history of asthma, anxiety, anemia, panic attack, H. Pylori.  Patient was seen in our ED on 5/22 due to cervical spine abscess.  Dr. Marcell Barlow of neurosurgery was consulted, recommended to transfer patient to tertiary facility.  Patient was transferred to [facility], and had I&D on 5/24.  Tissue culture showed MRSA.  Patient was initially started on vancomycin, but developed allergic reaction, diagnosed as DRESS syndrome by dermatology on tapering steroids, then switched to daptomycin.  Patient has received 4 weeks of antibiotics, will need 2 more weeks of daptomycin IV.  Patient has anemia and received blood transfusion there.   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 Patient blood pressure was low this morning, but the patient was asymptomatic.  Repeat blood pressure was better.  Patient is not on any blood pressure medicine.  No evidence of sepsis. Continue daptomycin.  DRESS syndrome, from vancomycin allergy Reviewed medical record from [facility], continue to complete course of steroids per recommendation from dermatology.    Bipolar disorder with moderate depression (HCC) Continue quetiapine 100 mg, 5 tabs nightly Trazodone 150 nightly   Chronic pain Continue gabapentin 300 mg twice daily Oxycodone 5 mg every 4 hours as needed Voltaren gel Zanaflex 2 mg every 6 as needed Lidocaine patch 4% 2 patches daily   IDA (iron deficiency anemia) Acute on chronic anemia. Continue ferrous sulfate B12 level 232, homocystine level 5.5, no to B12 deficiency.  Due to relative low level B12, will continue B12 orally, will not need prescription at discharge. Hemoglobin is better.    Subjective:  Patient doing well today, no complaint.  Physical Exam: Vitals:   09/17/22 1537 09/17/22 1933 09/18/22 0449 09/18/22 0819  BP: 106/75 110/67 92/64 91/61   Pulse: 79 80 71 67  Resp: 16 20 14 16   Temp: 98.1 F (36.7 C) 98.3 F (36.8 C) 97.9 F (36.6 C) 97.7 F (36.5 C)  TempSrc:   Oral   SpO2: 98% 97% 97% 98%  Weight:      Height:       General exam: Appears calm and comfortable, obesity Respiratory system: Clear to auscultation. Respiratory effort normal. Cardiovascular system: S1 & S2 heard, RRR. No JVD, murmurs, rubs, gallops or clicks. No pedal edema. Gastrointestinal system: Abdomen is nondistended, soft and nontender. No organomegaly or masses felt. Normal bowel sounds heard. Central nervous system: Alert and oriented. No focal neurological deficits. Extremities: Symmetric 5 x 5 power. Skin: No rashes, lesions or ulcers Psychiatry: Judgement and insight appear normal. Mood & affect appropriate.    Data Reviewed:  Lab results reviewed.  Family  Communication: Boyfriend at bedside updated.  Disposition: Status is: Inpatient Remains inpatient appropriate because: Severity of disease, IV treatment.     Time spent: 35 minutes  Author: Marrion Coy, MD 09/18/2022 9:26 AM  For on call review [email or web address].

## 2022-09-18 NOTE — Plan of Care (Signed)

## 2022-09-19 DIAGNOSIS — T50905A Adverse effect of unspecified drugs, medicaments and biological substances, initial encounter: Secondary | ICD-10-CM | POA: Diagnosis not present

## 2022-09-19 DIAGNOSIS — D7212 Drug rash with eosinophilia and systemic symptoms syndrome: Secondary | ICD-10-CM | POA: Diagnosis not present

## 2022-09-19 DIAGNOSIS — D508 Other iron deficiency anemias: Secondary | ICD-10-CM | POA: Diagnosis not present

## 2022-09-19 DIAGNOSIS — G061 Intraspinal abscess and granuloma: Secondary | ICD-10-CM | POA: Diagnosis not present

## 2022-09-19 LAB — CBC
HCT: 31.9 % — ABNORMAL LOW (ref 36.0–46.0)
Hemoglobin: 9.4 g/dL — ABNORMAL LOW (ref 12.0–15.0)
MCH: 23.8 pg — ABNORMAL LOW (ref 26.0–34.0)
MCHC: 29.5 g/dL — ABNORMAL LOW (ref 30.0–36.0)
MCV: 80.8 fL (ref 80.0–100.0)
Platelets: 219 10*3/uL (ref 150–400)
RBC: 3.95 MIL/uL (ref 3.87–5.11)
RDW: 27.4 % — ABNORMAL HIGH (ref 11.5–15.5)
WBC: 10.6 10*3/uL — ABNORMAL HIGH (ref 4.0–10.5)
nRBC: 0 % (ref 0.0–0.2)

## 2022-09-19 LAB — BASIC METABOLIC PANEL
Anion gap: 6 (ref 5–15)
BUN: 26 mg/dL — ABNORMAL HIGH (ref 6–20)
CO2: 25 mmol/L (ref 22–32)
Calcium: 8.7 mg/dL — ABNORMAL LOW (ref 8.9–10.3)
Chloride: 102 mmol/L (ref 98–111)
Creatinine, Ser: 0.68 mg/dL (ref 0.44–1.00)
GFR, Estimated: >60 mL/min (ref >60)
Glucose, Bld: 115 mg/dL — ABNORMAL HIGH (ref 70–99)
Potassium: 4 mmol/L (ref 3.5–5.1)
Sodium: 133 mmol/L — ABNORMAL LOW (ref 135–145)

## 2022-09-19 MED ORDER — ENOXAPARIN SODIUM 60 MG/0.6ML IJ SOSY
45.0000 mg | PREFILLED_SYRINGE | INTRAMUSCULAR | Status: DC
  Filled 2022-09-19 (×5): qty 0.6

## 2022-09-19 NOTE — Progress Notes (Signed)
Progress Note   Patient: Kayla Mcgee ZOX:[id number] DOB: 12-Oct-1980 DOA: 09/15/2022     4 DOS: the patient was seen and examined on 09/19/2022   Brief hospital course: MARLEAN SNOWBERGER is a 42 y.o. female with medical history significant of 42 year old lady who is here as a transfer back from [facility] .  Has past medical history of asthma, anxiety, anemia, panic attack, H. Pylori.  Patient was seen in our ED on 5/22 due to cervical spine abscess.  Dr. Marcell Barlow of neurosurgery was consulted, recommended to transfer patient to tertiary facility.  Patient was transferred to [facility], and had I&D on 5/24.  Tissue culture showed MRSA.  Patient was initially started on vancomycin, but developed allergic reaction, diagnosed as DRESS syndrome by dermatology on tapering steroids, then switched to daptomycin.  Patient has received 4 weeks of antibiotics, will need 2 more weeks of daptomycin IV.  Patient has anemia and received blood transfusion there.   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 Patient blood pressure was low this morning, but the patient was asymptomatic.  Repeat blood pressure was better.  Patient is not on any blood pressure medicine.  No evidence of sepsis. Patient tolerating antibiotic well, no diarrhea associated with it.   DRESS syndrome, from vancomycin allergy Reviewed medical record from [facility], continue to complete course of  steroids per recommendation from dermatology.   Bipolar disorder with moderate depression (HCC) Continue quetiapine 100 mg, 5 tabs nightly Trazodone 150 nightly   Chronic pain Continue gabapentin 300 mg twice daily Oxycodone 5 mg every 4 hours as needed Voltaren gel Zanaflex 2 mg every 6 as needed Lidocaine patch 4% 2 patches daily   IDA (iron deficiency anemia) Acute on chronic anemia. Continue ferrous sulfate B12 level 232, homocystine level 5.5, no to B12 deficiency.  Due to relative low level B12, will continue B12 orally, will not need prescription at discharge. Anemia improving.        Subjective:  Patient has no complaint today.  Physical Exam: Vitals:   09/18/22 1549 09/18/22 2020 09/19/22 0459 09/19/22 0813  BP: 121/78 122/84 109/66 (!) 125/93  Pulse: 82 90 71 (!) 107  Resp: 16 18 18 16   Temp: 98.5 F (36.9 C) 98.2 F (36.8 C) 98.4 F (36.9 C) 98.7 F (37.1 C)  TempSrc:      SpO2: 97% 96% 97% 99%  Weight:      Height:       General exam: Appears calm and comfortable  Respiratory system: Clear to auscultation. Respiratory effort normal. Cardiovascular system: S1 & S2 heard, RRR. No JVD, murmurs, rubs, gallops or clicks. No pedal edema. Gastrointestinal system: Abdomen is nondistended, soft and nontender. No organomegaly or masses felt. Normal bowel sounds heard. Central nervous system: Alert and oriented. No focal neurological deficits. Extremities: Symmetric 5 x 5 power. Skin: No rashes, lesions or ulcers Psychiatry: Judgement and insight appear normal. Mood & affect  appropriate.    Data Reviewed:  There are no new results to review at this time.  Family Communication: Boyfriend updated at bedside.  Disposition: Status is: Inpatient Remains inpatient appropriate because: Severity of disease, IV treatment.     Time spent: 35 minutes  Author: Marrion Coy, MD 09/19/2022 11:08 AM  For on call review [email or web address].

## 2022-09-20 DIAGNOSIS — D7212 Drug rash with eosinophilia and systemic symptoms syndrome: Secondary | ICD-10-CM | POA: Diagnosis not present

## 2022-09-20 DIAGNOSIS — T50905A Adverse effect of unspecified drugs, medicaments and biological substances, initial encounter: Secondary | ICD-10-CM | POA: Diagnosis not present

## 2022-09-20 DIAGNOSIS — G061 Intraspinal abscess and granuloma: Secondary | ICD-10-CM | POA: Diagnosis not present

## 2022-09-20 DIAGNOSIS — D649 Anemia, unspecified: Secondary | ICD-10-CM | POA: Diagnosis not present

## 2022-09-20 LAB — CBC
HCT: 31.5 % — ABNORMAL LOW (ref 36.0–46.0)
Hemoglobin: 9.6 g/dL — ABNORMAL LOW (ref 12.0–15.0)
MCH: 24.1 pg — ABNORMAL LOW (ref 26.0–34.0)
MCHC: 30.5 g/dL (ref 30.0–36.0)
MCV: 79.1 fL — ABNORMAL LOW (ref 80.0–100.0)
Platelets: 249 10*3/uL (ref 150–400)
RBC: 3.98 MIL/uL (ref 3.87–5.11)
RDW: 27.2 % — ABNORMAL HIGH (ref 11.5–15.5)
WBC: 10.2 10*3/uL (ref 4.0–10.5)
nRBC: 0 % (ref 0.0–0.2)

## 2022-09-20 LAB — MRSA NEXT GEN BY PCR, NASAL: MRSA by PCR Next Gen: NOT DETECTED

## 2022-09-20 NOTE — TOC Initial Note (Addendum)
Transition of Care [facility]) - Initial/Assessment Note    Patient Details  Name: Kayla Mcgee MRN: [id number] Date of Birth: April 03, 1980  [facility] CM/SW Contact:    Garret Reddish, RN Phone Number: 09/20/2022, 11:42 AM  Clinical Narrative:     Chart reviewed.  Noted that patient was admitted with Cervical spine abscess.  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  Patient is has received 4 weeks of IV antibiotics.  Patient is currently on IV Daptomycin. End date will be 09-24-22.    I have spoken with Kayla Mcgee.  She informs me that she currently homeless.  She informs me that she plans on staying with her daughter for a few days on discharge.  She is going to follow up with her daughter to see if she will allow her to stay with her for a few days after discharge.    Patient reports that she has Medicaid but will not have money to assist with her co-pays on discharge. I have placed [facility] on chart.  Patient informs me that she may also need transport to her daughter's home on discharge.    Patient will need to remain in the hospital till 09-24-2022.  Patient currently is homeless.    I have added Substance abuse resources  housing resources, and transportation resources on patient's discharge instructions.   TOC will continue to follow for discharge planning.      Expected Discharge Plan:  (Patient reports that she is currently homeless and will plan to discharge to her daughters home on discharge.) Barriers to Discharge: Homeless with medical needs   Patient Goals and CMS Choice            Expected Discharge Plan and Services                                              Prior Living Arrangements/Services   Lives with::  (Patient is homeless) Patient language and need for interpreter reviewed:: Yes                  Activities of Daily Living Home Assistive Devices/Equipment: None ADL Screening (condition at time of admission) Patient's cognitive ability adequate to safely complete daily activities?: Yes Is the patient deaf or have difficulty hearing?: No Does the patient have difficulty seeing, even when wearing glasses/contacts?: No Does the patient have difficulty concentrating, remembering, or making decisions?: No Patient able to express need for assistance with ADLs?: No Does the patient have difficulty dressing or bathing?: No Independently performs ADLs?: Yes (appropriate for developmental age) Does the patient have difficulty walking or climbing stairs?: No Weakness of Legs: None Weakness of Arms/Hands: None  Permission Sought/Granted                  Emotional Assessment       Orientation: : Oriented to Self, Oriented to Place, Oriented to  Time, Oriented to Situation Alcohol / Substance Use: Illicit Drugs    Admission diagnosis:  Post-operative infection [T81.40XA] Patient Active Problem List   Diagnosis Date Noted   Acute on chronic anemia 09/16/2022   DRESS syndrome, from vancomycin allergy 09/15/2022   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) 09/15/2022   Obesity (BMI 30-39.9)  09/15/2022   Hyponatremia 09/15/2022   Chronic pain 11/11/2020   Disorder of skeletal system 11/11/2020   Problems influencing health status 11/11/2020   Bipolar disorder with moderate depression (HCC) 09/09/2020   Cocaine abuse (HCC) 09/09/2020   ADHD 09/09/2020   Bipolar affective disorder, current episode hypomanic (HCC) 02/07/2020   PTSD (post-traumatic stress disorder) 02/07/2020   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:   [facility]  [address], [place] - [address] [place] [address] Phone: [phone] Fax: [phone]  [id number] [place], [place] - [address] AT [facility] OF [address] [place] [address] Phone: [phone] Fax: [phone]     Social Determinants of Health (SDOH) Social History: SDOH Screenings   Food Insecurity: Food Insecurity Present (09/15/2022)  Housing: Medium Risk (09/15/2022)  Transportation Needs: Unmet Transportation Needs (09/15/2022)  Utilities: Patient Unable To Answer (09/15/2022)  Tobacco Use: High Risk (09/15/2022)   SDOH Interventions:     Readmission Risk Interventions     No data to display

## 2022-09-20 NOTE — Progress Notes (Signed)
Colleague seen pt coming in the front door after sneaking outside. Pt was educated about not going outside with the central line accessible. Pt was educated on the risk for infection and how it is not safe. Pt was given another arm band and told if it happens again that she would have to be moved in front of the nurses station. Pt offered a nicotine patch but declined.

## 2022-09-20 NOTE — Progress Notes (Signed)
Progress Note   Patient: Kayla Mcgee ZOX:[id number] DOB: 14-Jul-1980 DOA: 09/15/2022     5 DOS: the patient was seen and examined on 09/20/2022   Brief hospital course: Kayla Mcgee is a 42 y.o. female with medical history significant of 42 year old lady who is here as a transfer back from [facility] .  Has past medical history of asthma, anxiety, anemia, panic attack, H. Pylori.  Patient was seen in our ED on 5/22 due to cervical spine abscess.  Dr. Marcell Barlow of neurosurgery was consulted, recommended to transfer patient to tertiary facility.  Patient was transferred to [facility], and had I&D on 5/24.  Tissue culture showed MRSA.  Patient was initially started on vancomycin, but developed allergic reaction, diagnosed as DRESS syndrome by dermatology on tapering steroids, then switched to daptomycin.  Patient has received 4 weeks of antibiotics, will need 2 more weeks of daptomycin IV.  Patient has anemia and received blood transfusion there.   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 Patient blood pressure was low this morning, but the patient was asymptomatic.  Repeat blood pressure was better.  Patient is not on any blood pressure medicine.  No evidence of sepsis. Patient doing well.   DRESS syndrome, from vancomycin allergy Reviewed medical record from [facility], continue to complete course of steroids per recommendation from dermatology.    Bipolar disorder with moderate depression (HCC) Continue quetiapine 100 mg, 5 tabs nightly Trazodone 150 nightly   Chronic pain Continue gabapentin 300 mg twice daily Oxycodone 5 mg every 4 hours as needed Voltaren gel Zanaflex 2 mg every 6 as needed Lidocaine patch 4% 2 patches daily   IDA (iron deficiency anemia) Acute on chronic anemia. Continue ferrous sulfate B12 level 232, homocystine level 5.5, no to B12 deficiency.  Due to relative low level B12, will continue B12 orally, will not need prescription at discharge. Hemoglobin is better.      Subjective:  Patient currently has no complaint.  Physical Exam: Vitals:   09/19/22 1614 09/19/22 2006 09/20/22 0529 09/20/22 0817  BP: 115/65 128/88 110/72 104/75  Pulse: 85 94 77 78  Resp: 16 15 16 18   Temp: 98.1 F (36.7 C) 97.8 F (36.6 C) 98.4 F (36.9 C) 97.7 F (36.5 C)  TempSrc:  Oral Oral   SpO2: 97% 96% 97% 98%  Weight:      Height:       General exam: Appears calm and comfortable  Respiratory system: Clear to auscultation. Respiratory effort normal. Cardiovascular system: S1 & S2 heard, RRR. No JVD, murmurs, rubs, gallops or clicks. No pedal edema. Gastrointestinal system: Abdomen is nondistended, soft and nontender. No organomegaly or masses felt. Normal bowel sounds heard. Central nervous system: Alert and oriented. No focal neurological deficits. Extremities: Symmetric 5 x 5 power. Skin: No rashes, lesions or ulcers Psychiatry: Judgement and insight appear normal. Mood & affect appropriate.    Data Reviewed:  There are  no new results to review at this time.  Family Communication: Boyfriend updated at bedside.  Disposition: Status is: Inpatient Remains inpatient appropriate because: Severity of disease, IV antibiotics.     Time spent: 35 minutes  Author: Marrion Coy, MD 09/20/2022 10:13 AM  For on call review [email or web address].

## 2022-09-20 NOTE — Discharge Instructions (Signed)
Intensive Outpatient Programs   [facility] [address] [place],  [place], [place] [phone]  [facility] (Inpatient and [phone] (Suboxone and Methadone) [address] [phone]  ADS: Alcohol & Drug [facility] Programs - Intensive Outpatient [address] [place], [place] [address], [place]  [phone]  [facility] (Outpatient, Inpatient, [facility] (Groups and Residental) (insurance only) [phone] [place], [place] [phone]   [facility] (for caregivers and family) [address] Laurell Josephs [address], [place], [place] [phone]  Residential Treatment Programs  [facility](2 years) Residential: 29 days)[facility] ([facility] [address] [place], [place], [place] [phone] or [phone]  [facility] [address] [place], [place], [place] [phone]  [facility] (RTS) [address] [place], [place], [place] [phone] Admissions: 8am-3pm M-F  [facility] Program: Residential Program [phone] Days)             [facility]: [facility]  [place], [place], [place]  [phone] or [phone] in Hours over the weekend or by referral)   Mobil Crisis: [facility]:[phone] (for crisis response 24 hours a day)    Rent/Utility/Housing  Agency Name: [facility] Address: [address] [place], [place] [address] Phone: [phone] Email:  [email or web address] Website: www.[email or web address].org Service(s) Offered: Housing services, self-sufficiency, congregate meal program, weatherization program, Field seismologist program, emergency food assistance,  housing counseling, home ownership program, wheels -towork program.  Agency Name: [facility] Address: [address] [place], [place] [address] Phone: [phone] (8a-4p) [phone] (8p- 10p) Email: [email or web address] Website: [email or web address] Service(s) Offered: A program for homeless and/or needy men that includes one-on-one counseling, life skills training and job rehabilitation.  Agency Name: [employer] [facility] [place] Address: [address] [place], [place] [address] Phone: [phone] Website: www.alliedchurches.org Service(s) Offered: Assistance to needy in emergency with utility bills, heating fuel, and prescriptions. Shelter for homeless 7pm-7am. July 15, 2016 15  Agency Name: [facility] [place] (Developmentally Disabled) Address: [address], [place], [place] [address] Phone: [phone] Contact Person: Cathleen Corti Email: [email or web address] Website: [email or web address] Service(s) Offered: Helps individuals with developmental disabilities move from housing that is more restrictive to homes where they  can achieve greater independence and have more  opportunities.  Agency Name: [facility] Address: [address], [place], [place] [address] Phone: [phone] Email: [email or web address] Website: www.[email or web address].org Service(s) Offered: Provides affordable housing for low-income families, elderly, and disabled individuals. Offer a wide range of  programs and services, from financial planning to afterschool and summer programs.  Agency Name: [facility] Address: [address] [place], [place] [address] Phone:  [phone] Service(s) Offered: Child support services; child welfare services; food stamps; Medicaid; work first family assistance; and aid with fuel,  rent, food and medicine.  Agency Name: [facility] [place] [employer]. Address: [facility] [address] [place], [place]  [address] Phone: [phone] Website: www.familyabuseservices.org Service(s) Offered: 24 hour Crisis Line: [phone]; 24 hour Emergency Shelter; Transitional Housing; Support Groups; Scientist, physiological; [employer]; Hispanic Outreach: [phone];  Visitation Center: [phone].  Agency Name: [facility] [place]. Address: [address] [place], [place] [address] Phone: [phone] Service(s) Offered: CAP Services; Home and [employer]; Individual  or Group Supports; Respite Care Non-Institutional Nursing;  Residential Supports; Respite Care and Personal Care Services; Transportation; Family and Friends Night; Recreational Activities; Three Nutritious Meals/Snacks; Consultation with Registered Dietician; Twenty-four hour Registered Nurse Access; Daily and [employer]; Camp Green Leaves; Clifford for the [employer] (During Summer Months) Bingo Night (Every  Wednesday Night); Special Populations Dance Night  (Every Tuesday Night); Professional Hair Care Services.  Agency Name: [facility] Address: [address], [place], [place] [address] Phone: [phone] Contact Person: Jabier Mutton Website: [email or web address].Physicist, medical) Offered: Residential treatment facility for women; food and  clothing, educational & employment development and  transportation to work; Counsellor of financial skills;  parenting and family reunification; emotional and spiritual  support; transitional housing for program graduates.  Agency Name: [facility] Address: [address], [place], [place] [address] Phone:  [phone] Email: [email or web address] Website: TaskTown.es Service(s) Offered: Public housing units for elderly, disabled, and low income people; housing choice vouchers for income eligible  applicants; shelter plus care vouchers; and Psychologist, clinical.  Agency Name: [facility] [employer] Address: [address] [place], [place] [address] Phone: [phone] Email: [email or web address]@[email or web address] Website: www.habitatalamance.org Service(s) Offered: Build houses for families in need of decent housing. Each adult in the family must invest 200 hours of labor on  someone else's house, work with volunteers to build their own house, attend classes on budgeting, home maintenance, yard care, and attend homeowner association meetings.  Agency Name: [facility] Address: [address], [place], [place] [address] Phone: [phone] Website: www.rsli.org Service(s) Offered: Intermediate care facilities for intellectually delayed, Supervised Living in group homes for adults with developmental disabilities, Supervised Living for people who have dual diagnoses (MRMI), Independent Living, Supported Living, respite and a variety of CAP services, pre-vocational services, day supports, and [employer].  Agency Name: [place] [employer] Phone: [phone] Website: www.NCForeclosurePrevention.gov Service(s) Offered: Zero-interest, deferred loans to homeowners struggling to pay their mortgage. Call for more information.    Transportation Resources  Agency Name: [facility] Address: [address] [place], [place] [address] Phone: [phone] Email: [email or web address] Website: www.alamanceservices.org Service(s) Offered: Housing services, self-sufficiency, congregate meal program, weatherization program, Field seismologist program, emergency food assistance,  housing counseling,  home ownership program, wheels-towork program.  Agency Name: [facility] [phone]) Address: [address], [place], [place] [address] Phone: [phone] Website: www.acta-[email or web address] Service(s) Offered: Transportation for [employer], subscription and demand response; Dial-a-Ride for citizens 24 years of age or older.  Agency Name: Department of Social Services Address: [address] [place], [place] [address] Phone: [phone] Service(s) Offered: Child support services; child welfare services; food stamps; Medicaid; work first family assistance; and aid with fuel,  rent, food and medicine, transportation assistance.  Agency Name: Disabled [employer] (DAV) Transportation  Network Phone: [phone] Service(s) Offered: Transports veterans to the [facility] medical center. Call  forty-eight hours in advance and leave the name, telephone  number, date, and time of appointment. Veteran will be  contacted by the driver the day before the appointment to  arrange a pick up point

## 2022-09-21 DIAGNOSIS — D7212 Drug rash with eosinophilia and systemic symptoms syndrome: Secondary | ICD-10-CM | POA: Diagnosis not present

## 2022-09-21 DIAGNOSIS — F3132 Bipolar disorder, current episode depressed, moderate: Secondary | ICD-10-CM | POA: Diagnosis not present

## 2022-09-21 DIAGNOSIS — G894 Chronic pain syndrome: Secondary | ICD-10-CM | POA: Diagnosis not present

## 2022-09-21 DIAGNOSIS — G061 Intraspinal abscess and granuloma: Secondary | ICD-10-CM | POA: Diagnosis not present

## 2022-09-21 MED ORDER — HYDROCODONE-ACETAMINOPHEN 5-325 MG PO TABS
1.0000 | ORAL_TABLET | ORAL | Status: DC | PRN
  Administered 2022-09-21 – 2022-09-24 (×9): 1 via ORAL
  Filled 2022-09-21 (×10): qty 1

## 2022-09-21 MED ORDER — DICLOFENAC SODIUM 1 % EX GEL
2.0000 g | Freq: Four times a day (QID) | CUTANEOUS | Status: DC | PRN
  Administered 2022-09-23 – 2022-09-24 (×2): 2 g via TOPICAL

## 2022-09-21 MED ORDER — LIDOCAINE 5 % EX PTCH: 2.0000 | MEDICATED_PATCH | Freq: Every day | CUTANEOUS | Status: DC | PRN

## 2022-09-21 NOTE — Progress Notes (Addendum)
Progress Note   Patient: Kayla Mcgee JXB:[id number] DOB: [birth year] DOA: 09/15/2022     6 DOS: the patient was seen and examined on 09/21/2022   Brief hospital course: ANESHIA VILAS is a 42 y.o. female with medical history significant of 42 year old lady who is here as a transfer back from [facility] .  Has past medical history of asthma, anxiety, anemia, panic attack, H. Pylori.  Patient was seen in our ED on 5/22 due to cervical spine abscess.  Dr. Marcell Barlow of neurosurgery was consulted, recommended to transfer patient to tertiary facility.  Patient was transferred to [facility], and had I&D on 5/24.  Tissue culture showed MRSA.  Patient was initially started on vancomycin, but developed allergic reaction, diagnosed as DRESS syndrome by dermatology on tapering steroids, then switched to daptomycin.  Patient has received 4 weeks of antibiotics, will need 2 more weeks of daptomycin IV.  Patient has anemia and received blood transfusion there.  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 Patient blood pressure was low this morning, but the patient was asymptomatic.  She is not on any blood pressure medicine.  No evidence of sepsis. Patient doing well.   DRESS syndrome, from vancomycin allergy Reviewed medical record from [facility], continue to complete course of steroids per recommendation from dermatology.   Bipolar disorder with moderate depression (HCC) Continue quetiapine 100 mg, 5 tabs nightly Trazodone 150 nightly   Chronic pain Continue gabapentin 300 mg twice daily Oxycodone 5 mg every 4 hours as needed Voltaren gel as needed Zanaflex 2 mg every 6 as needed Lidocaine patch 4% 2 patches daily as needed   IDA (iron deficiency anemia) Acute on chronic anemia. Continue ferrous  sulfate B12 level 232, homocystine level 5.5, no to B12 deficiency.  Due to relative low level B12, will continue B12 orally, will not need prescription at discharge. Hemoglobin is better.      Subjective: No new issues.  Patient was requested to go ahead and make her appointment with her PCP and neurosurgery at [facility] so that she can continue to get her narcotics as an outpatient  Physical Exam: Vitals:   09/20/22 1605 09/20/22 2030 09/21/22 0512 09/21/22 0811  BP: 114/80 126/75 (!) 93/57 (!) 104/59  Pulse: 86 87 79 70  Resp: 16 18  18   Temp: 97.8 F (36.6 C) 98.2 F (36.8 C) 98.2 F (36.8 C) 98.6 F (37 C)  TempSrc:  Oral    SpO2: 98% 97% 99% 99%  Weight:      Height:       General exam: Appears calm and comfortable  Respiratory system: Clear to auscultation. Respiratory effort normal. Cardiovascular system: S1 & S2 heard, RRR. No JVD, murmurs, rubs, gallops or clicks. No pedal edema. Gastrointestinal system: Abdomen is nondistended, soft and nontender. No organomegaly or masses felt. Normal bowel sounds heard. Central nervous system: Alert and oriented. No focal neurological deficits. Extremities: Symmetric 5 x 5 power. Skin: No rashes, lesions or ulcers Psychiatry: Judgement and insight appear normal. Mood & affect appropriate.  Data Reviewed:  There are no new results to review at this time.  Family Communication: Husband at bedside  Disposition: Status is: Inpatient Remains inpatient appropriate because: Management of prevertebral abscess, IV antibiotics  Planned Discharge Destination: Home   DVT prophylaxis -  Lovenox Time spent: 35 minutes  Author: Delfino Lovett, MD 09/21/2022 11:30 AM  For on call review [email or web address].

## 2022-09-21 NOTE — Progress Notes (Signed)
Pharmacy - Antimicrobial Stewardship  On daptomycin for MRSA pre-vertebral abscess s/p surgery at [facility].  End date for Daptomycin per [facility] ID is 09/24/22 with recommendation to start doxycyline 100mg  PO BID 09/25/22 for chronic suppressive therapy.   Plan - Order for doxycycline 100mg  po BID has been saved in the discharge navigator,  added 2 refills.  This can be signed at discharge medication reconciliation   Juliette Alcide, PharmD, BCPS, BCIDP Work Cell: [phone] 09/21/2022 8:14 AM

## 2022-09-22 DIAGNOSIS — F3132 Bipolar disorder, current episode depressed, moderate: Secondary | ICD-10-CM | POA: Diagnosis not present

## 2022-09-22 DIAGNOSIS — G061 Intraspinal abscess and granuloma: Secondary | ICD-10-CM | POA: Diagnosis not present

## 2022-09-22 DIAGNOSIS — G894 Chronic pain syndrome: Secondary | ICD-10-CM | POA: Diagnosis not present

## 2022-09-22 DIAGNOSIS — D7212 Drug rash with eosinophilia and systemic symptoms syndrome: Secondary | ICD-10-CM | POA: Diagnosis not present

## 2022-09-22 LAB — CREATININE, SERUM
Creatinine, Ser: 0.56 mg/dL (ref 0.44–1.00)
GFR, Estimated: >60 mL/min (ref >60)

## 2022-09-22 NOTE — Progress Notes (Signed)
Progress Note   Patient: Kayla Mcgee [id number]:[id number] DOB: 27-Jul-1980 DOA: 09/15/2022     7 DOS: the patient was seen and examined on 09/22/2022   Brief hospital course: ALLANNA ARROLIGA is a 42 y.o. female with medical history significant of 42 year old lady who is here as a transfer back from [facility] .  Has past medical history of asthma, anxiety, anemia, panic attack, H. Pylori.  Patient was seen in our ED on 5/22 due to cervical spine abscess.  Dr. Marcell Barlow of neurosurgery was consulted, recommended to transfer patient to tertiary facility.  Patient was transferred to [facility], and had I&D on 5/24.  Tissue culture showed MRSA.  Patient was initially started on vancomycin, but developed allergic reaction, diagnosed as DRESS syndrome by dermatology on tapering steroids, then switched to daptomycin.  Patient has received 4 weeks of antibiotics, will need 2 more weeks of daptomycin IV.  Patient has anemia and received blood transfusion there.  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  Recommendation is to start doxycycline 100 mg p.o. twice daily on 09/25/2022 for chronic suppressive therapy.   DRESS syndrome, from vancomycin allergy Reviewed medical record from [facility], continue to complete course of steroids per recommendation from dermatology.   Bipolar disorder with moderate depression (HCC) Continue quetiapine 100 mg, 5 tabs nightly Trazodone 150 nightly   Chronic pain Continue gabapentin 300 mg twice daily Oxycodone 5 mg every 4 hours as needed Voltaren gel as needed Zanaflex 2 mg every 6 as needed Lidocaine patch 4% 2 patches daily as needed   IDA (iron deficiency anemia) Acute on chronic anemia. Continue ferrous  sulfate B12 level 232, homocystine level 5.5, no to B12 deficiency.  Due to relative low level B12, will continue B12 orally, will not need prescription at discharge. Hemoglobin is better.      Subjective: No new issues.  Husband sleeping in the bed and patient sitting in the chair  Physical Exam: Vitals:   09/21/22 1612 09/21/22 2042 09/22/22 0452 09/22/22 0828  BP: (!) 134/91 130/87 102/69 103/62  Pulse: 80 81 76 62  Resp: 17 18 19 20   Temp: 97.9 F (36.6 C) 98.2 F (36.8 C) 98.3 F (36.8 C) 98.1 F (36.7 C)  TempSrc:   Oral   SpO2: 99% 98% 98% 100%  Weight:      Height:       General exam: Appears calm and comfortable  Respiratory system: Clear to auscultation. Respiratory effort normal. Cardiovascular system: S1 & S2 heard, RRR. No JVD, murmurs, rubs, gallops or clicks. No pedal edema. Gastrointestinal system: Abdomen is nondistended, soft and nontender. No organomegaly or masses felt. Normal bowel sounds heard. Central nervous system: Alert and oriented. No focal neurological deficits. Extremities: Symmetric 5 x 5 power. Skin: No rashes, lesions or ulcers Psychiatry: Judgement and insight appear normal. Mood & affect appropriate.  Data Reviewed:  There are no new results to review at this time.  Family Communication: Husband at bedside  Disposition: Status is: Inpatient Remains inpatient appropriate because: Management of prevertebral abscess, IV antibiotics  Planned Discharge Destination: Home   DVT prophylaxis -Lovenox Time spent: 25 minutes  Author: Delfino Lovett, MD 09/22/2022 1:17 PM  For on call review [email or web address].

## 2022-09-23 DIAGNOSIS — G894 Chronic pain syndrome: Secondary | ICD-10-CM | POA: Diagnosis not present

## 2022-09-23 DIAGNOSIS — D7212 Drug rash with eosinophilia and systemic symptoms syndrome: Secondary | ICD-10-CM | POA: Diagnosis not present

## 2022-09-23 DIAGNOSIS — G061 Intraspinal abscess and granuloma: Secondary | ICD-10-CM | POA: Diagnosis not present

## 2022-09-23 DIAGNOSIS — F3132 Bipolar disorder, current episode depressed, moderate: Secondary | ICD-10-CM | POA: Diagnosis not present

## 2022-09-23 LAB — CK: Total CK: 22 U/L — ABNORMAL LOW (ref 38–234)

## 2022-09-23 NOTE — Progress Notes (Signed)
Progress Note   Patient: Kayla Mcgee [id number]:[id number] DOB: 05-17-1980 DOA: 09/15/2022     8 DOS: the patient was seen and examined on 09/23/2022   Brief hospital course: Kayla Mcgee is a 42 y.o. female with medical history significant of 42 year old lady who is here as a transfer back from [facility] .  Has past medical history of asthma, anxiety, anemia, panic attack, H. Pylori.  Patient was seen in our ED on 5/22 due to cervical spine abscess.  Dr. Marcell Barlow of neurosurgery was consulted, recommended to transfer patient to tertiary facility.  Patient was transferred to [facility], and had I&D on 5/24.  Tissue culture showed MRSA.  Patient was initially started on vancomycin, but developed allergic reaction, diagnosed as DRESS syndrome by dermatology on tapering steroids, then switched to daptomycin.  Patient has received 4 weeks of antibiotics, will need 2 more weeks of daptomycin IV.  Patient has anemia and received blood transfusion there.  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  Recommendation is to start doxycycline 100 mg p.o. twice daily on 09/25/2022 for chronic suppressive therapy.   DRESS syndrome, from vancomycin allergy Reviewed medical record from [facility], continue to complete course of steroids per recommendation from dermatology.   Bipolar disorder with moderate depression (HCC) Continue quetiapine 100 mg, 5 tabs nightly Trazodone 150 nightly   Chronic pain Continue gabapentin 300 mg twice daily Oxycodone 5 mg every 4 hours as needed Voltaren gel as needed Zanaflex 2 mg every 6 as needed Lidocaine patch 4% 2 patches daily as needed   IDA (iron deficiency anemia) Acute on chronic anemia. Continue ferrous  sulfate B12 level 232, homocystine level 5.5, no to B12 deficiency.  Due to relative low level B12, will continue B12 orally, will not need prescription at discharge. Hemoglobin is better.      Subjective: No new issues.  Husband sleeping in the bed and patient sitting comfortably in the chair  Physical Exam: Vitals:   09/22/22 1538 09/22/22 1950 09/23/22 0447 09/23/22 0740  BP: 119/81 126/74 94/61 (!) 99/56  Pulse: 83 80 71 69  Resp: 20 18 16 17   Temp: 97.8 F (36.6 C) 98.6 F (37 C) 97.8 F (36.6 C) 98.1 F (36.7 C)  TempSrc:  Oral Oral   SpO2: 98% 99% 100% 98%  Weight:      Height:       General exam: Appears calm and comfortable  Respiratory system: Clear to auscultation. Respiratory effort normal. Cardiovascular system: S1 & S2 heard, RRR. No JVD, murmurs, rubs, gallops or clicks. No pedal edema. Gastrointestinal system: Abdomen is nondistended, soft and nontender. No organomegaly or masses felt. Normal bowel sounds heard. Central nervous system: Alert and oriented. No focal neurological deficits. Extremities: Symmetric 5 x 5 power. Skin: No rashes, lesions or ulcers Psychiatry: Judgement and insight appear normal. Mood & affect appropriate.  Data Reviewed:  There are no new results to review at this time.  Family Communication: Husband at bedside  Disposition: Status is: Inpatient Remains inpatient appropriate because: Management of prevertebral abscess, IV antibiotics  Planned Discharge Destination: Home   DVT prophylaxis -Lovenox Time spent: 25 minutes  Author: Delfino Lovett, MD 09/23/2022 1:51 PM  For on call review  [email or web address]

## 2022-09-24 ENCOUNTER — Other Ambulatory Visit: Payer: Self-pay

## 2022-09-24 MED ORDER — HYDROCODONE-ACETAMINOPHEN 5-325 MG PO TABS
1.0000 | ORAL_TABLET | ORAL | 0 refills | Status: AC | PRN
  Filled 2022-09-24: qty 18, 3d supply, fill #0

## 2022-09-24 MED ORDER — PREDNISONE 10 MG PO TABS
ORAL_TABLET | ORAL | 0 refills | Status: AC
  Filled 2022-09-24: qty 9, 6d supply, fill #0

## 2022-09-24 MED ORDER — PREDNISONE 20 MG PO TABS
20.0000 mg | ORAL_TABLET | Freq: Every day | ORAL | 0 refills | Status: DC
  Filled 2022-09-24: qty 3, 3d supply, fill #0

## 2022-09-24 MED ORDER — DOXYCYCLINE HYCLATE 100 MG PO TABS
100.0000 mg | ORAL_TABLET | Freq: Two times a day (BID) | ORAL | 2 refills | Status: DC
  Filled 2022-09-24: qty 60, 30d supply, fill #0

## 2022-09-24 NOTE — Progress Notes (Signed)
Pt requested note for documentation of hospital stay. MD notified. Work note prepared. Printed and provided to pt.

## 2022-09-24 NOTE — Progress Notes (Signed)
Pt medications provided from pharmacy, given to pt. No questions following d/c medication education. Pt prepared for d/c. Pt refuses wheelchair transport for d/c and chooses to walk to medical mall for discharge.

## 2022-09-24 NOTE — TOC Transition Note (Signed)
Transition of Care [facility]) - CM/SW Discharge Note   Patient Details  Name: Kayla Mcgee MRN: [id number] Date of Birth: Mar 20, 1981  [facility]) CM/SW Contact:  Allena Katz, LCSW Phone Number: 09/24/2022, 9:14 AM   Clinical Narrative:   Pt discharging home to daughters. CSW to provide taxi voucher to daughters house. Pharmacy to deliver medications to patients room.    Final next level of care: Home/Self Care Barriers to Discharge: [facility] Outpatient Resources   Patient Goals and CMS Choice CMS Medicare.gov Compare Post Acute Care list provided to:: Patient Choice offered to / list presented to : Patient  Discharge Placement                         Discharge Plan and Services Additional resources added to the After Visit Summary for                                       Social Determinants of Health (SDOH) Interventions SDOH Screenings   Food Insecurity: Food Insecurity Present (09/15/2022)  Housing: Medium Risk (09/15/2022)  Transportation Needs: Unmet Transportation Needs (09/15/2022)  Utilities: Patient Unable To Answer (09/15/2022)  Tobacco Use: High Risk (09/15/2022)     Readmission Risk Interventions     No data to display

## 2022-09-25 NOTE — Discharge Summary (Signed)
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 *  Hospital Course: Kayla Mcgee is a 42 y.o. female with medical history significant of 42 year old lady who is here as a transfer back from [facility] .  Has past medical history of asthma, anxiety, anemia, panic attack, H. Pylori.  Patient was seen in our ED on 5/22 due to cervical spine abscess.  Dr. Marcell Barlow of neurosurgery was consulted, recommended to transfer patient to tertiary facility.  Patient was transferred to [facility], and had I&D on 5/24.  Tissue culture showed MRSA.  Patient was initially started on vancomycin, but developed allergic reaction, diagnosed as DRESS syndrome by dermatology on tapering steroids, then switched to daptomycin.  Patient has received 4 weeks of antibiotics, will need 2 more weeks of daptomycin IV.  Patient has anemia and received blood transfusion there.  7/2: Patient refusing diclofenac gel and lidocaine patches so changing it to as needed 7/2: Patient refusing diclofenac gel and lidocaine patches so changing it to as needed 7/3-7/4: She has made an appointment with her primary care for 7/8  Assessment and Plan:   Prevertebral abscess s/p washout  and hardware evaluation (5/24) Hx C4-6 ACDF in July 2023   Treated with daptomycin with last dose on 7/5.  Recommendation is to start doxycycline 100 mg p.o. twice daily on 09/25/2022 for chronic suppressive therapy.   DRESS syndrome, from vancomycin allergy Reviewed medical record from [facility], continue to complete course of steroids per recommendation from dermatology.   Bipolar disorder with moderate depression (HCC) Continue quetiapine 100 mg, 5 tabs nightly Trazodone 150 nightly   Chronic pain Continue gabapentin 300 mg twice daily Oxycodone 5 mg every 4 hours as needed Voltaren gel as needed Zanaflex 2 mg every 6 as needed Lidocaine patch 4% 2 patches daily as needed I have instructed her to make sure she follows with her outpatient doctors who is prescribing her chronic pain medication and make sure she abides by pain contract   IDA (iron deficiency anemia) Acute on chronic anemia. Continue ferrous sulfate B12 level 232, homocystine level 5.5, no to B12 deficiency.  Due to relative low level B12, will continue B12 orally, will not need prescription at discharge. Hemoglobin is better.           Disposition: Home Diet recommendation:  Discharge Diet Orders (From admission, onward)     Start     Ordered   09/24/22 0000  Diet - low sodium heart healthy        09/24/22 0839           Carb modified diet DISCHARGE MEDICATION: Allergies as of 09/24/2022       Reactions   Rifampin Other (See Comments)   DRESS - June 2024  while at [facility] while being treating for MRSA prevertebral abscess.  Dermatology felt reaction was likely DRESS from rifampin or vancomycin.     Vancomycin Other (See Comments)   DRESS - June 2024 while at [facility] while being treating for MRSA prevertebral abscess.  Dermatology felt reaction was likely DRESS from rifampin or vancomycin.     Sulfa Antibiotics Hives, Nausea And Vomiting        Medication List     STOP taking these medications    daptomycin   IVPB Commonly known as: CUBICIN   oxyCODONE 5 MG immediate release tablet Commonly known as: Oxy IR/ROXICODONE       TAKE these medications    albuterol 108 (90 Base) MCG/ACT inhaler Commonly known as: VENTOLIN HFA Inhale 2 puffs into the lungs every 4 (four) hours as needed for wheezing or shortness of breath.   cetirizine 10 MG chewable tablet Commonly known as: ZYRTEC Chew by mouth.   diphenhydrAMINE 50 MG capsule Commonly known as: BENADRYL Take 50 mg by mouth every 6 (six) hours as needed for itching.   doxycycline 100 MG tablet Commonly known as: VIBRA-TABS Take 1 tablet (100 mg total) by mouth 2 (two) times daily.   enoxaparin 40 MG/0.4ML injection Commonly known as: LOVENOX Inject 40 mg into the skin daily.   ferrous sulfate 325 (65 FE) MG tablet Take 325 mg by mouth every other day.   fluticasone 50 MCG/ACT nasal spray Commonly known as: FLONASE Place 2 sprays into both nostrils in the morning and at bedtime.   gabapentin 300 MG capsule Commonly known as: NEURONTIN Take 200 mg by mouth daily at 6 (six) AM.   gabapentin 300 MG capsule Commonly known as: NEURONTIN Take 600 mg by mouth at bedtime.   guaifenesin 100 MG/5ML syrup Commonly known as: ROBITUSSIN Take 200 mg by mouth every 4 (four) hours as needed for cough or congestion.   HYDROcodone-acetaminophen 5-325 MG tablet Commonly known as: NORCO/VICODIN Take 1 tablet by mouth every 4 (four) hours as needed for up to 3 days for moderate pain.   hydrocortisone 2.5 % cream Apply topically 2 (two) times daily.   hydrOXYzine 50 MG tablet Commonly known as: ATARAX Take 50 mg by mouth every 6 (six) hours as needed for itching, anxiety or nausea.   lidocaine 5 % Commonly known as: LIDODERM Place 2 patches onto the skin daily. Remove & Discard patch within 12 hours or as directed by MD   nicotine 21 mg/24hr patch Commonly known as: NICODERM CQ - dosed in mg/24 hours Place 21 mg onto the skin  daily.   nicotine polacrilex 4 MG gum Commonly known as: NICORETTE Take 4 mg by mouth every hour as needed for smoking cessation.   pantoprazole 40 MG tablet Commonly known as: PROTONIX Take 40 mg by mouth daily.   polyethylene glycol 17 g packet Commonly known as: MIRALAX / GLYCOLAX Take 17 g by mouth 2 (two) times daily.   predniSONE 10 MG tablet Commonly known as: DELTASONE Take 2 tablets (20 mg total) by mouth daily with breakfast for 3 days, THEN 1 tablet (10 mg total) daily with breakfast for 3 days. Start taking on: September 27, 2022   prenatal multivitamin Tabs tablet Take 1 tablet by mouth daily.   pseudoephedrine 30 MG tablet Commonly known as: SUDAFED Take 30 mg by mouth every 4 (four) hours as needed for congestion.   QUEtiapine 100 MG tablet Commonly known as: SEROQUEL Take 500 mg by mouth at bedtime.  senna 8.6 MG tablet Commonly known as: SENOKOT Take 2 tablets by mouth 2 (two) times daily.   tiZANidine 2 MG tablet Commonly known as: ZANAFLEX Take 2 mg by mouth every 6 (six) hours as needed for muscle spasms.   traZODone 50 MG tablet Commonly known as: DESYREL Take 150 mg by mouth at bedtime.   triamcinolone cream 0.1 % Commonly known as: KENALOG Apply 1 Application topically 2 (two) times daily.   Voltaren 1 % Gel Generic drug: diclofenac Sodium Apply topically 4 (four) times daily.        Follow-up Information     [facility]. Schedule an appointment as soon as possible for a visit in 3 day(s).   Specialty: General Practice Why: [facility] Discharge F/UP Contact information: [address] [place] [address] [phone]                Discharge Exam: Ceasar Mons Weights   09/15/22 0329  Weight: 94.1 kg   General exam: Appears calm and comfortable  Respiratory system: Clear to auscultation. Respiratory effort normal. Cardiovascular system: S1 & S2 heard, RRR. No JVD, murmurs, rubs, gallops or clicks. No  pedal edema. Gastrointestinal system: Abdomen is nondistended, soft and nontender. No organomegaly or masses felt. Normal bowel sounds heard. Central nervous system: Alert and oriented. No focal neurological deficits. Extremities: Symmetric 5 x 5 power. Skin: No rashes, lesions or ulcers Psychiatry: Judgement and insight appear normal. Mood & affect appropriate.   Condition at discharge: good  The results of significant diagnostics from this hospitalization (including imaging, microbiology, ancillary and laboratory) are listed below for reference.   Imaging Studies: DG Chest Port 1 View  Result Date: 09/15/2022 CLINICAL DATA:  PICC (peripherally inserted central catheter) in place EXAM: PORTABLE CHEST 1 VIEW COMPARISON:  Chest x-ray May 12, 2018. FINDINGS: On the first image PICC tip projects at the superior right atrium. On the second image PICC tip projects at the superior cavoatrial junction. No visible pneumothorax. Enlarged cardiac silhouette. Pulmonary vascular congestion. No consolidation. No overt pulmonary edema. No visible pleural effusions. IMPRESSION: 1. PICC tip projects near the superior cavoatrial junction. No visible pneumothorax. 2. Cardiomegaly and pulmonary vascular congestion without overt pulmonary edema. Electronically Signed   By: Feliberto Harts M.D.   On: 09/15/2022 15:55    Microbiology: Results for orders placed or performed during the hospital encounter of 09/15/22  MRSA Next Gen by PCR, Nasal     Status: None   Collection Time: 09/20/22 12:02 PM   Specimen: Nasal Mucosa; Nasal Swab  Result Value Ref Range Status   MRSA by PCR Next Gen NOT DETECTED NOT DETECTED Final    Comment: (NOTE) The GeneXpert MRSA Assay (FDA approved for NASAL specimens only), is one component of a comprehensive MRSA colonization surveillance program. It is not intended to diagnose MRSA infection nor to guide or monitor treatment for MRSA infections. Test performance is not FDA  approved in patients less than 55 years old. Performed at [facility], [address] [place], [place] [address]     Labs: CBC: Recent Labs  Lab 09/19/22 0615 09/20/22 0525  WBC 10.6* 10.2  HGB 9.4* 9.6*  HCT 31.9* 31.5*  MCV 80.8 79.1*  PLT 219 249   Basic Metabolic Panel: Recent Labs  Lab 09/19/22 0615 09/22/22 0555  NA 133*  --   K 4.0  --   CL 102  --   CO2 25  --   GLUCOSE 115*  --  BUN 26*  --   CREATININE 0.68 0.56  CALCIUM 8.7*  --    Liver Function Tests: No results for input(s): "AST", "ALT", "ALKPHOS", "BILITOT", "PROT", "ALBUMIN" in the last 168 hours. CBG: No results for input(s): "GLUCAP" in the last 168 hours.  Discharge time spent: greater than 30 minutes.  Signed: Delfino Lovett, MD [facility] 09/25/2022

## 2023-01-22 ENCOUNTER — Emergency Department
Admission: EM | Admit: 2023-01-22 | Discharge: 2023-01-22 | Disposition: A | Discharge status: Home / Self Care | Payer: Self-pay | Attending: Emergency Medicine | Admitting: Emergency Medicine

## 2023-01-22 ENCOUNTER — Emergency Department: Payer: Self-pay

## 2023-01-22 ENCOUNTER — Other Ambulatory Visit: Payer: Self-pay

## 2023-01-22 DIAGNOSIS — S00511A Abrasion of lip, initial encounter: Secondary | ICD-10-CM | POA: Insufficient documentation

## 2023-01-22 DIAGNOSIS — T148XXA Other injury of unspecified body region, initial encounter: Secondary | ICD-10-CM

## 2023-01-22 DIAGNOSIS — X58XXXA Exposure to other specified factors, initial encounter: Secondary | ICD-10-CM | POA: Insufficient documentation

## 2023-01-22 DIAGNOSIS — S0993XA Unspecified injury of face, initial encounter: Secondary | ICD-10-CM

## 2023-01-22 DIAGNOSIS — S62114A Nondisplaced fracture of triquetrum [cuneiform] bone, right wrist, initial encounter for closed fracture: Secondary | ICD-10-CM

## 2023-01-22 LAB — CBC
HCT: 31.4 % — ABNORMAL LOW (ref 36.0–46.0)
Hemoglobin: 10 g/dL — ABNORMAL LOW (ref 12.0–15.0)
MCH: 24.1 pg — ABNORMAL LOW (ref 26.0–34.0)
MCHC: 31.8 g/dL (ref 30.0–36.0)
MCV: 75.7 fL — ABNORMAL LOW (ref 80.0–100.0)
Platelets: 324 10*3/uL (ref 150–400)
RBC: 4.15 MIL/uL (ref 3.87–5.11)
RDW: 15.2 % (ref 11.5–15.5)
WBC: 6.7 10*3/uL (ref 4.0–10.5)
nRBC: 0 % (ref 0.0–0.2)

## 2023-01-22 LAB — COMPREHENSIVE METABOLIC PANEL
ALT: 26 U/L (ref 0–44)
AST: 34 U/L (ref 15–41)
Albumin: 3.6 g/dL (ref 3.5–5.0)
Alkaline Phosphatase: 80 U/L (ref 38–126)
Anion gap: 9 (ref 5–15)
BUN: 11 mg/dL (ref 6–20)
CO2: 23 mmol/L (ref 22–32)
Calcium: 8.7 mg/dL — ABNORMAL LOW (ref 8.9–10.3)
Chloride: 104 mmol/L (ref 98–111)
Creatinine, Ser: 0.65 mg/dL (ref 0.44–1.00)
GFR, Estimated: >60 mL/min (ref >60)
Glucose, Bld: 106 mg/dL — ABNORMAL HIGH (ref 70–99)
Potassium: 3.5 mmol/L (ref 3.5–5.1)
Sodium: 136 mmol/L (ref 135–145)
Total Bilirubin: 0.6 mg/dL (ref 0.3–1.2)
Total Protein: 7.7 g/dL (ref 6.5–8.1)

## 2023-01-22 LAB — LIPASE, BLOOD: Lipase: 28 U/L (ref 11–51)

## 2023-01-22 MED ORDER — KETOROLAC TROMETHAMINE 30 MG/ML IJ SOLN
30.0000 mg | Freq: Once | INTRAMUSCULAR | Status: AC
  Administered 2023-01-22: 30 mg via INTRAMUSCULAR
  Filled 2023-01-22: qty 1

## 2023-01-22 MED ORDER — NAPROXEN 500 MG PO TABS
500.0000 mg | ORAL_TABLET | Freq: Two times a day (BID) | ORAL | 2 refills | Status: AC
  Filled 2023-01-22: qty 20, 10d supply, fill #0

## 2023-01-22 NOTE — ED Notes (Signed)
IV DC, AVS provided as well as cab voucher. PT waiting for cab
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
## 2023-01-23 ENCOUNTER — Other Ambulatory Visit: Payer: Self-pay

## 2023-01-24 ENCOUNTER — Other Ambulatory Visit: Payer: Self-pay

## 2023-02-03 ENCOUNTER — Other Ambulatory Visit: Payer: Self-pay

## 2024-04-12 ENCOUNTER — Emergency Department
Admission: EM | Admit: 2024-04-12 | Discharge: 2024-04-12 | Disposition: A | Discharge status: Home / Self Care | Attending: Emergency Medicine | Admitting: Emergency Medicine

## 2024-04-12 ENCOUNTER — Other Ambulatory Visit: Payer: Self-pay

## 2024-04-12 DIAGNOSIS — L03211 Cellulitis of face: Secondary | ICD-10-CM | POA: Diagnosis not present

## 2024-04-12 DIAGNOSIS — S60463A Insect bite (nonvenomous) of left middle finger, initial encounter: Secondary | ICD-10-CM | POA: Diagnosis not present

## 2024-04-12 DIAGNOSIS — R0981 Nasal congestion: Secondary | ICD-10-CM | POA: Diagnosis not present

## 2024-04-12 DIAGNOSIS — W57XXXA Bitten or stung by nonvenomous insect and other nonvenomous arthropods, initial encounter: Secondary | ICD-10-CM | POA: Insufficient documentation

## 2024-04-12 DIAGNOSIS — S6992XA Unspecified injury of left wrist, hand and finger(s), initial encounter: Secondary | ICD-10-CM | POA: Diagnosis present

## 2024-04-12 DIAGNOSIS — J45909 Unspecified asthma, uncomplicated: Secondary | ICD-10-CM | POA: Insufficient documentation

## 2024-04-12 MED ORDER — CEPHALEXIN 500 MG PO CAPS: 500.0000 mg | ORAL_CAPSULE | Freq: Four times a day (QID) | ORAL | 0 refills | Status: AC

## 2024-04-12 MED ORDER — HYDROCODONE-ACETAMINOPHEN 5-325 MG PO TABS: 1.0000 | ORAL_TABLET | Freq: Three times a day (TID) | ORAL | 0 refills | Status: AC | PRN

## 2024-04-12 MED ORDER — HYDROCODONE-ACETAMINOPHEN 5-325 MG PO TABS
1.0000 | ORAL_TABLET | Freq: Once | ORAL | Status: AC
  Administered 2024-04-12: 1 via ORAL
  Filled 2024-04-12: qty 1

## 2024-04-12 MED ORDER — CEPHALEXIN 500 MG PO CAPS
500.0000 mg | ORAL_CAPSULE | Freq: Once | ORAL | Status: AC
  Administered 2024-04-12: 500 mg via ORAL
  Filled 2024-04-12: qty 1

## 2024-04-12 MED ORDER — DOXYCYCLINE HYCLATE 100 MG PO TABS: 100.0000 mg | ORAL_TABLET | Freq: Two times a day (BID) | ORAL | 0 refills | Status: AC

## 2024-04-12 MED ORDER — DOXYCYCLINE HYCLATE 100 MG PO TABS
100.0000 mg | ORAL_TABLET | Freq: Once | ORAL | Status: AC
  Administered 2024-04-12: 100 mg via ORAL
  Filled 2024-04-12: qty 1

## 2024-04-12 NOTE — ED Triage Notes (Signed)
 Pt presents to ED from home for multiple complaints. C/O bad sinus infection X 3 weeks, eczema and insect bite to L middle finger that she noticed this AM. Reports her finger has gotten more swollen throughout the day since she noticed the bite.

## 2024-04-12 NOTE — Discharge Instructions (Signed)
 Take the antibiotics as directed.  Keep the wound clean, dry, and covered and necessary.  Monitor for any changes, and return to the ED as needed.

## 2024-04-12 NOTE — ED Provider Notes (Signed)
 "   [facility] Emergency Department Provider Note     Event Date/Time   First MD Initiated Contact with Patient 04/12/24 1634     (approximate)   History   Nasal Congestion and Insect Bite   HPI  Kayla Mcgee is a 44 y.o. female with a history of anxiety, asthma, H. pylori, and eczema, who presents to the ED for evaluation of multiple complaints.  Patient complains what she believes to be bad sinus infection, with 3 weeks worth of symptoms.  She reports sinus congestion and nasal drainage.  No frank fevers, chills, or sweats.  She also reports some exacerbation of her eczema, primarily noting crusted lesions around the hairline.  She also reports this morning, she noted what she believes to be an insect bite to the middle finger of the right hand.  Noted a small erythematous pustule, with some streaking of the dorsum of the hand.  She also reports that finger swollen, she been unable to remove her ring prior to arrival.  She denies any spontaneous or purulent drainage.  No other symptoms or complaints at this time.     Physical Exam   Triage Vital Signs: ED Triage Vitals [04/12/24 1550]  Encounter Vitals Group     BP 127/88     Girls Systolic BP Percentile      Girls Diastolic BP Percentile      Boys Systolic BP Percentile      Boys Diastolic BP Percentile      Pulse Rate 77     Resp 18     Temp 98.4 F (36.9 C)     Temp Source Oral     SpO2 100 %     Weight 180 lb (81.6 kg)     Height 5' 5 (1.651 m)     Head Circumference      Peak Flow      Pain Score 7     Pain Loc      Pain Education      Exclude from Growth Chart     Most recent vital signs: Vitals:   04/12/24 1550  BP: 127/88  Pulse: 77  Resp: 18  Temp: 98.4 F (36.9 C)  SpO2: 100%    General Awake, no distress.  NAD HEENT NCAT. PERRL. EOMI. No rhinorrhea. Mucous membranes are moist.  CV:  Good peripheral perfusion.  RESP:  Normal effort.  MSK:  Normal composite fist  on the left.  Patient with a single erythematous blister to the dorsum of the middle finger at the PIP.  Some early lymphatic streaking is noted to the dorsum of the hand.  The finger is also swollen from the DIP through the middle phalanx, with a copper tone ring in place at the base of the finger. SKIN:  Patient with crusted hypertrophic lesions to the hairline primarily forehead and left temple.  No lymphangitis, streaking, purulence is noted.    ED Results / Procedures / Treatments   Labs (all labs ordered are listed, but only abnormal results are displayed) Labs Reviewed - No data to display   EKG   RADIOLOGY   No results found.   PROCEDURES:  Critical Care performed: No  Procedures Copper tone ring is removed using an electric ring cutter  MEDICATIONS ORDERED IN ED: Medications  cephALEXin  (KEFLEX ) capsule 500 mg (500 mg Oral Given 04/12/24 1722)  doxycycline  (VIBRA -TABS) tablet 100 mg (100 mg Oral Given 04/12/24 1722)  HYDROcodone -acetaminophen  (NORCO/VICODIN) 5-325 MG per  tablet 1 tablet (1 tablet Oral Given 04/12/24 1729)     IMPRESSION / MDM / ASSESSMENT AND PLAN / ED COURSE  I reviewed the triage vital signs and the nursing notes.                              Differential diagnosis includes, but is not limited to, contact dermatitis, cellulitis, insect bite, eczema, seborrheic dermatitis  Patient's presentation is most consistent with acute, uncomplicated illness.  Patient's diagnosis is consistent with cellulitis to the left middle finger as well as to the hairline.  Patient with an intact vesicle/papule to the dorsal right middle finger.  Some lymphatic swelling is noted.  The finger itself is edematous at the middle phalanx secondary to a fascial ring the patient has on in place.  No nail avulsion or purulent drainage is appreciated.  The vaginal ring is removed without difficulty.  The wound is intact without purulent drainage. Patient will be discharged home  with prescriptions for Keflex  and doxycycline  to cover for MSSA and MRSA, respectively.  Care instructions and supplies.  Patient is to follow up with her PCP for interim wound check as suggested, as needed or otherwise directed. Patient is given ED precautions to return to the ED for any worsening or new symptoms.   FINAL CLINICAL IMPRESSION(S) / ED DIAGNOSES   Final diagnoses:  Cellulitis of face  Insect bite of left middle finger, initial encounter     Rx / DC Orders   ED Discharge Orders          Ordered    cephALEXin  (KEFLEX ) 500 MG capsule  4 times daily        04/12/24 1726    doxycycline  (VIBRA -TABS) 100 MG tablet  2 times daily        04/12/24 1726    HYDROcodone -acetaminophen  (NORCO/VICODIN) 5-325 MG tablet  3 times daily PRN        04/12/24 1726             Note:  This document was prepared using Dragon voice recognition software and may include unintentional dictation errors.    Loyd Candida LULLA Aldona, PA-C 04/12/24 1740    Viviann Pastor, MD 04/12/24 2242  "
